# Patient Record
Sex: Female | Born: 1975
Health system: Southern US, Community
[De-identification: ages and names within clinical notes are randomized; demographics above are authoritative.]

## PROBLEM LIST (undated history)

## (undated) DIAGNOSIS — E039 Hypothyroidism, unspecified: Secondary | ICD-10-CM

## (undated) DIAGNOSIS — E782 Mixed hyperlipidemia: Secondary | ICD-10-CM

## (undated) DIAGNOSIS — E559 Vitamin D deficiency, unspecified: Secondary | ICD-10-CM

## (undated) DIAGNOSIS — I10 Essential (primary) hypertension: Secondary | ICD-10-CM

## (undated) DIAGNOSIS — R7303 Prediabetes: Secondary | ICD-10-CM

## (undated) HISTORY — DX: Prediabetes: R73.03

## (undated) HISTORY — DX: Hypothyroidism, unspecified: E03.9

## (undated) HISTORY — DX: Vitamin D deficiency, unspecified: E55.9

## (undated) HISTORY — DX: Mixed hyperlipidemia: E78.2

## (undated) HISTORY — DX: Essential (primary) hypertension: I10

---

## 2010-03-28 ENCOUNTER — Other Ambulatory Visit (HOSPITAL_COMMUNITY): Payer: Self-pay | Admitting: Obstetrics and Gynecology

## 2010-03-28 ENCOUNTER — Other Ambulatory Visit: Payer: Self-pay | Admitting: Obstetrics and Gynecology

## 2010-03-28 DIAGNOSIS — N852 Hypertrophy of uterus: Secondary | ICD-10-CM

## 2010-03-28 DIAGNOSIS — N920 Excessive and frequent menstruation with regular cycle: Secondary | ICD-10-CM

## 2010-03-28 DIAGNOSIS — N926 Irregular menstruation, unspecified: Secondary | ICD-10-CM

## 2010-04-07 ENCOUNTER — Ambulatory Visit (HOSPITAL_COMMUNITY)
Admission: RE | Admit: 2010-04-07 | Discharge: 2010-04-07 | Disposition: A | Discharge status: Home / Self Care | Payer: Self-pay | Source: Ambulatory Visit | Attending: Obstetrics and Gynecology | Admitting: Obstetrics and Gynecology

## 2010-04-07 DIAGNOSIS — N920 Excessive and frequent menstruation with regular cycle: Secondary | ICD-10-CM

## 2010-04-07 DIAGNOSIS — N852 Hypertrophy of uterus: Secondary | ICD-10-CM | POA: Insufficient documentation

## 2010-04-07 DIAGNOSIS — N926 Irregular menstruation, unspecified: Secondary | ICD-10-CM | POA: Insufficient documentation

## 2011-07-03 ENCOUNTER — Other Ambulatory Visit (HOSPITAL_COMMUNITY)
Admission: RE | Admit: 2011-07-03 | Discharge: 2011-07-03 | Disposition: A | Discharge status: Home / Self Care | Payer: Self-pay | Source: Ambulatory Visit | Attending: Unknown Physician Specialty | Admitting: Unknown Physician Specialty

## 2011-07-03 DIAGNOSIS — N870 Mild cervical dysplasia: Secondary | ICD-10-CM | POA: Insufficient documentation

## 2011-07-03 DIAGNOSIS — R87612 Low grade squamous intraepithelial lesion on cytologic smear of cervix (LGSIL): Secondary | ICD-10-CM | POA: Insufficient documentation

## 2012-03-19 IMAGING — US US PELV - US TRANSVAGINAL
1 series · 14 of 25 positions shown · non-contrast
Comparison: None

CLINICAL DATA: Enlarged uterus, irregular heavy menses



[Series 1: us pelv - us transvaginal · 0.21mm/px · 14 of 69 slices shown]
[im 1/69]
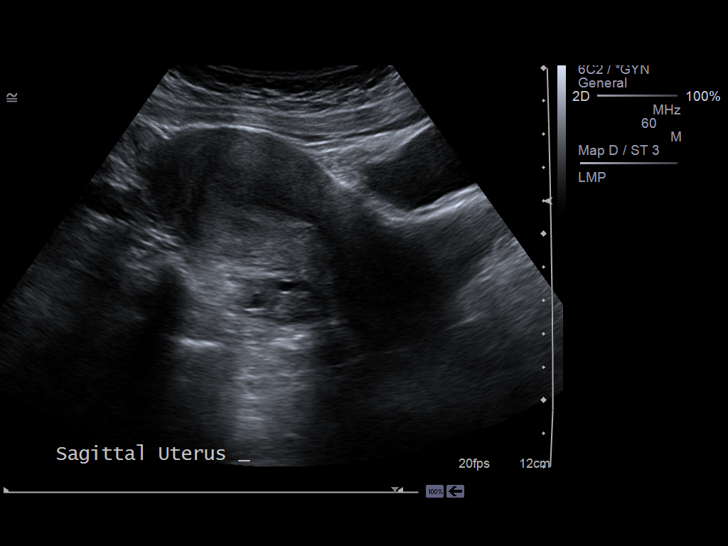
[im 6/69]
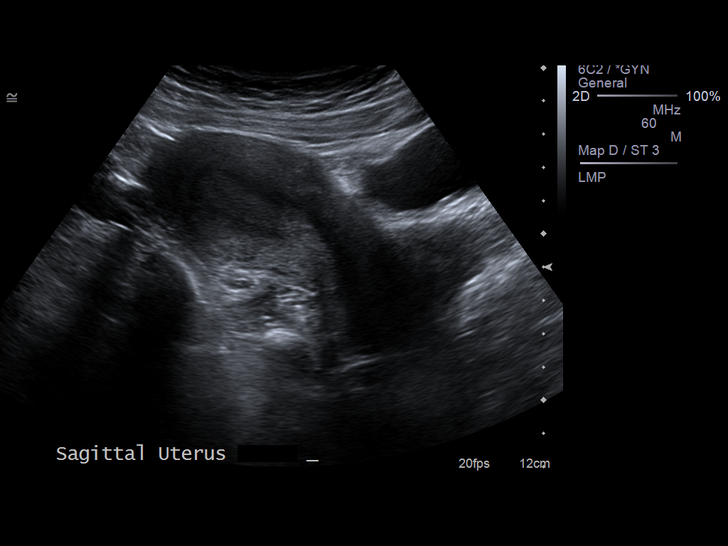
[im 12/69]
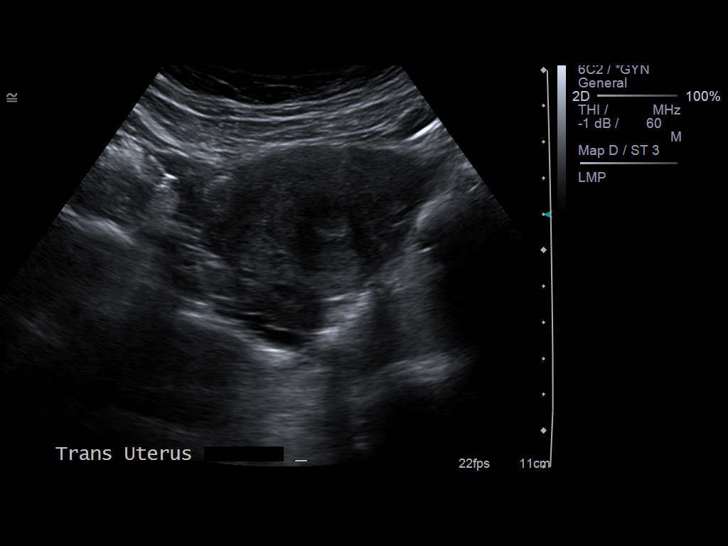
[im 18/69]
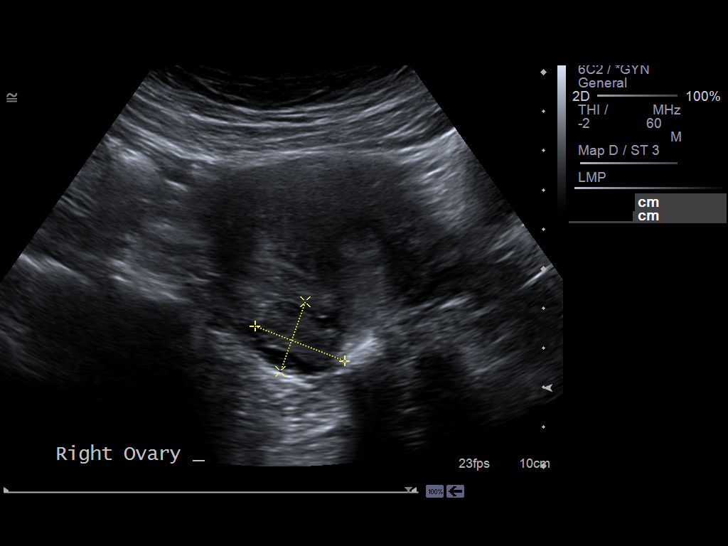
[im 23/69]
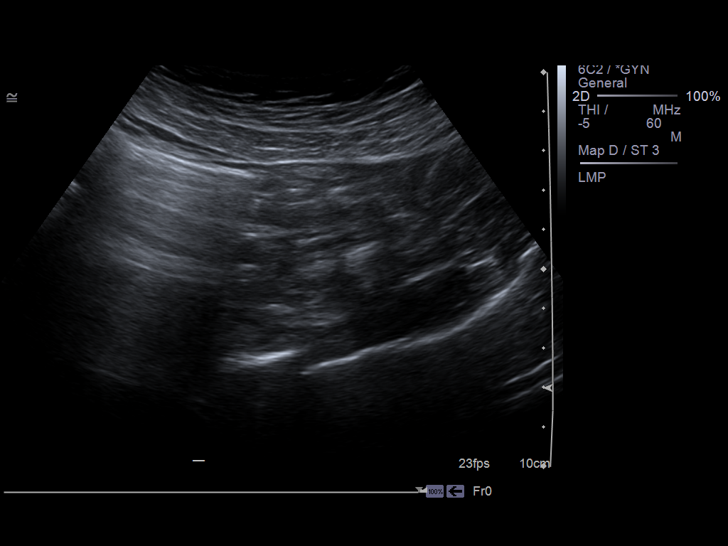
[im 26/69]
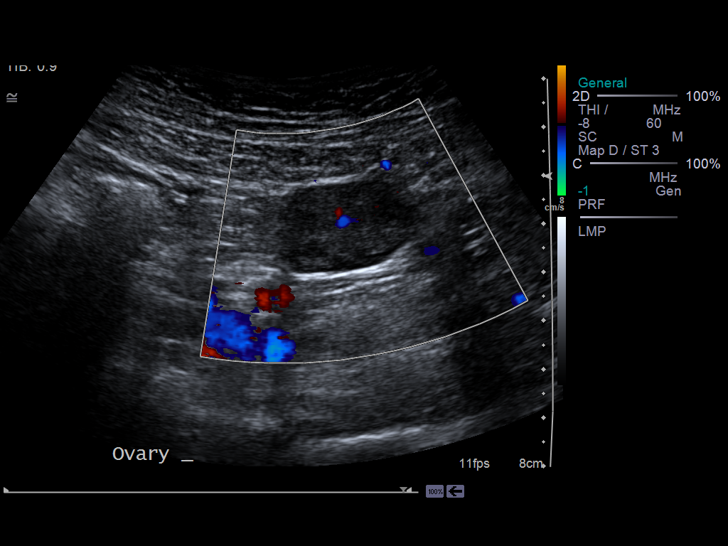
[im 32/69]
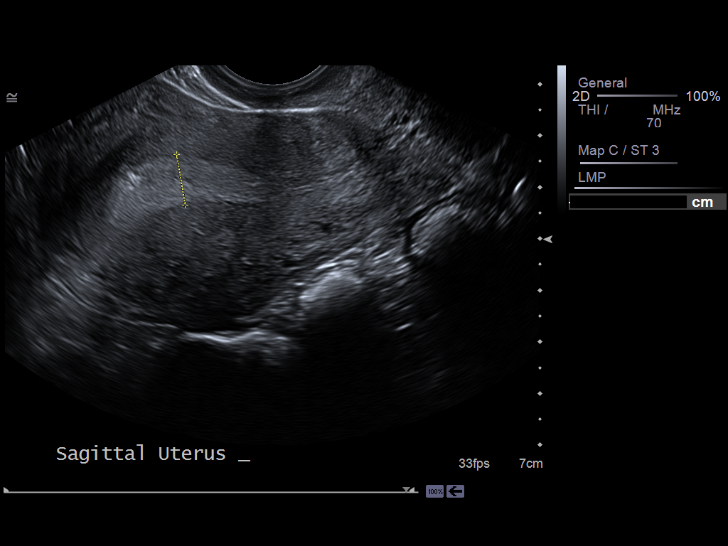
[im 37/69]
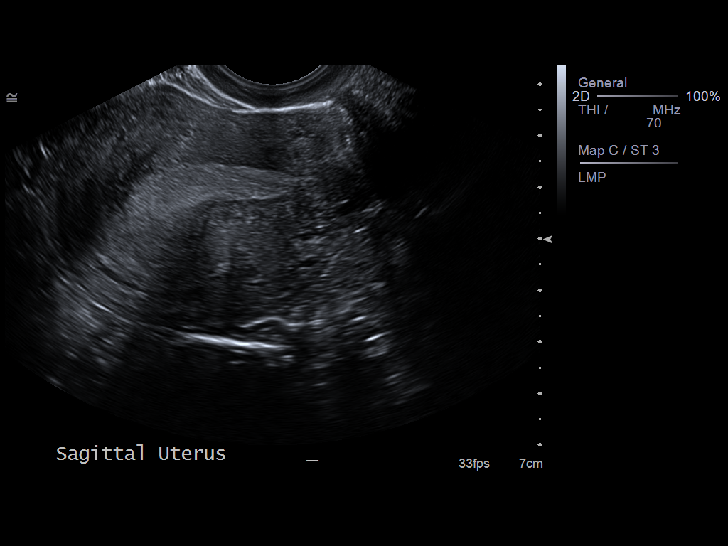
[im 43/69]
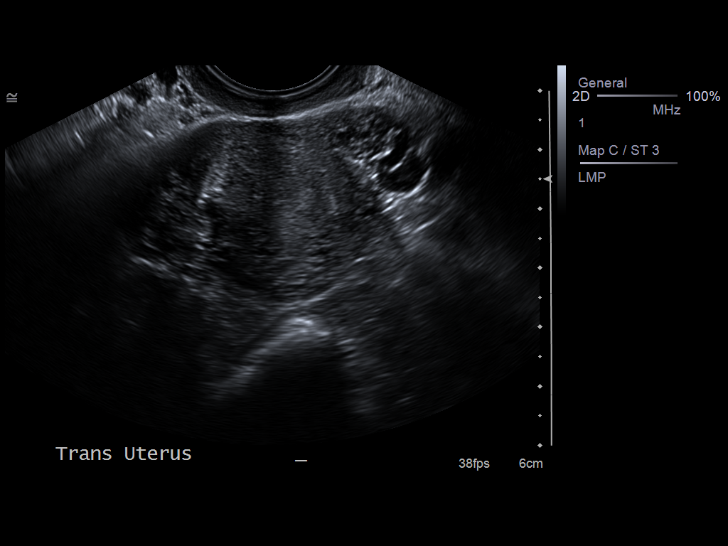
[im 46/69]
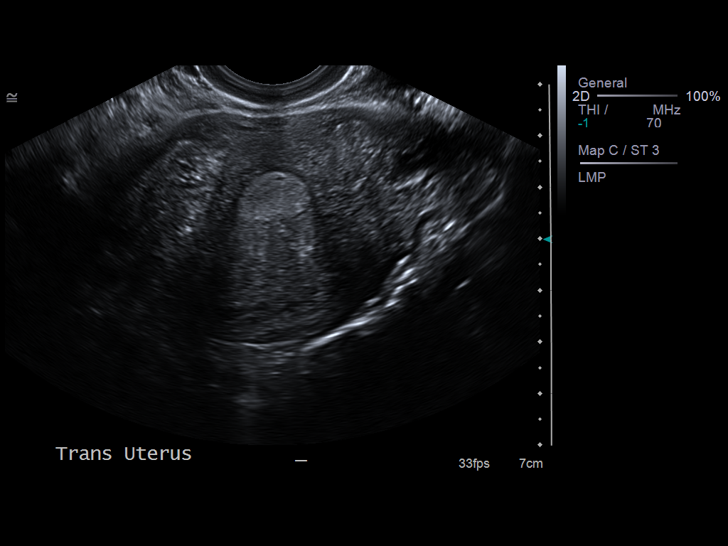
[im 52/69]
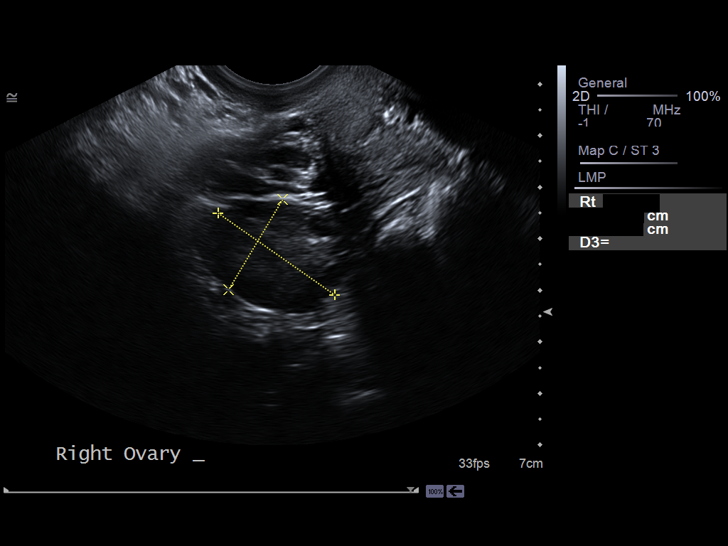
[im 57/69]
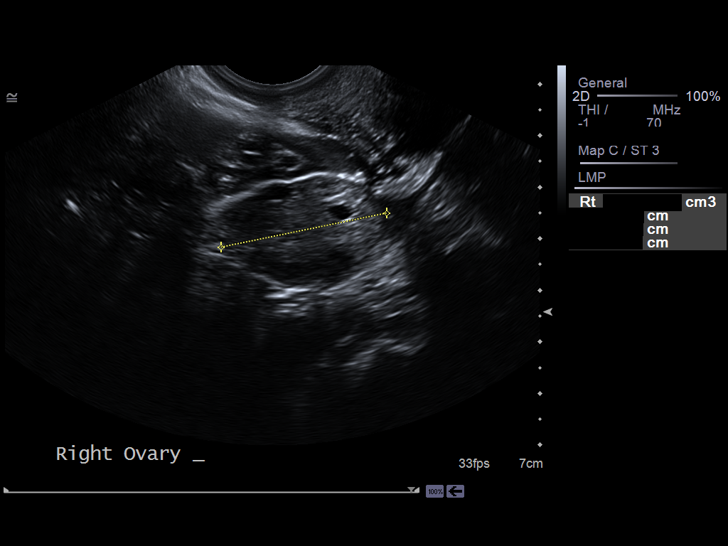
[im 63/69]
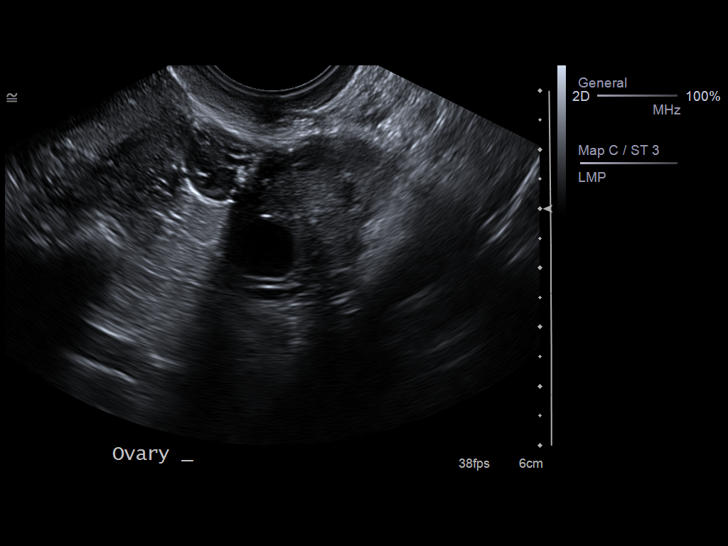
[im 69/69]
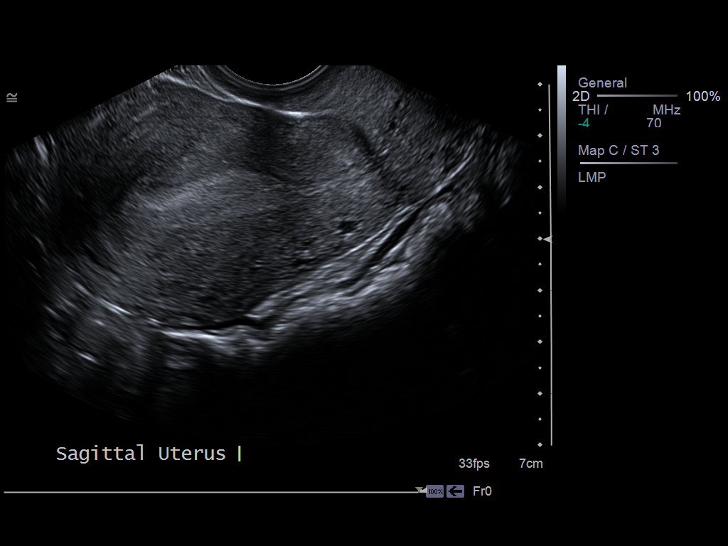

[14 of 25 positions shown; findings below may reference images not displayed]

FINDINGS: Uterus measures 8.7 cm length by 4.5 cm AP by 5.7 cm transverse.
Heterogeneous appearing myometrium without focal mass.

Endometrium measures 10 mm thick, upper normal.  No endometrial
fluid.

Right Ovary measures 2.0 x 2.0 x 3.3 cm.  Normal morphology without
mass.

Left Ovary measures 3.3 x 2.1 x 2.5 cm.  Normal morphology without
mass.

Other Findings:  No adnexal masses or free pelvic fluid.
IMPRESSION: Unremarkable sonography of the pelvis.

## 2012-08-12 ENCOUNTER — Encounter (HOSPITAL_COMMUNITY): Payer: Self-pay | Admitting: *Deleted

## 2012-08-12 ENCOUNTER — Emergency Department (HOSPITAL_COMMUNITY)
Admission: EM | Admit: 2012-08-12 | Discharge: 2012-08-12 | Disposition: A | Discharge status: Home / Self Care | Payer: BC Managed Care – PPO | Attending: Emergency Medicine | Admitting: Emergency Medicine

## 2012-08-12 DIAGNOSIS — R61 Generalized hyperhidrosis: Secondary | ICD-10-CM | POA: Insufficient documentation

## 2012-08-12 DIAGNOSIS — R000 Tachycardia, unspecified: Secondary | ICD-10-CM | POA: Insufficient documentation

## 2012-08-12 DIAGNOSIS — Z79899 Other long term (current) drug therapy: Secondary | ICD-10-CM | POA: Insufficient documentation

## 2012-08-12 DIAGNOSIS — E0590 Thyrotoxicosis, unspecified without thyrotoxic crisis or storm: Secondary | ICD-10-CM | POA: Insufficient documentation

## 2012-08-12 DIAGNOSIS — R634 Abnormal weight loss: Secondary | ICD-10-CM | POA: Insufficient documentation

## 2012-08-12 LAB — COMPREHENSIVE METABOLIC PANEL
ALT: 36 U/L — ABNORMAL HIGH (ref 0–35)
AST: 22 U/L (ref 0–37)
Albumin: 3.3 g/dL — ABNORMAL LOW (ref 3.5–5.2)
Alkaline Phosphatase: 142 U/L — ABNORMAL HIGH (ref 39–117)
BUN: 15 mg/dL (ref 6–23)
CO2: 30 mEq/L (ref 19–32)
Calcium: 9.9 mg/dL (ref 8.4–10.5)
Chloride: 97 mEq/L (ref 96–112)
Creatinine, Ser: 0.59 mg/dL (ref 0.50–1.10)
GFR calc Af Amer: >90 mL/min (ref >90)
GFR calc non Af Amer: >90 mL/min (ref >90)
Glucose, Bld: 119 mg/dL — ABNORMAL HIGH (ref 70–99)
Potassium: 3.9 mEq/L (ref 3.5–5.1)
Sodium: 137 mEq/L (ref 135–145)
Total Bilirubin: 0.2 mg/dL — ABNORMAL LOW (ref 0.3–1.2)
Total Protein: 8.6 g/dL — ABNORMAL HIGH (ref 6.0–8.3)

## 2012-08-12 LAB — CBC WITH DIFFERENTIAL/PLATELET
Basophils Absolute: 0 10*3/uL (ref 0.0–0.1)
Basophils Relative: 0 % (ref 0–1)
Eosinophils Absolute: 0 10*3/uL (ref 0.0–0.7)
Eosinophils Relative: 1 % (ref 0–5)
HCT: 35.3 % — ABNORMAL LOW (ref 36.0–46.0)
Hemoglobin: 11.9 g/dL — ABNORMAL LOW (ref 12.0–15.0)
Lymphocytes Relative: 19 % (ref 12–46)
Lymphs Abs: 1.5 10*3/uL (ref 0.7–4.0)
MCH: 30.7 pg (ref 26.0–34.0)
MCHC: 33.7 g/dL (ref 30.0–36.0)
MCV: 91.2 fL (ref 78.0–100.0)
Monocytes Absolute: 0.9 10*3/uL (ref 0.1–1.0)
Monocytes Relative: 11 % (ref 3–12)
Neutro Abs: 5.5 10*3/uL (ref 1.7–7.7)
Neutrophils Relative %: 69 % (ref 43–77)
Platelets: 734 10*3/uL — ABNORMAL HIGH (ref 150–400)
RBC: 3.87 MIL/uL (ref 3.87–5.11)
RDW: 12.7 % (ref 11.5–15.5)
WBC: 7.9 10*3/uL (ref 4.0–10.5)

## 2012-08-12 MED ORDER — METOPROLOL TARTRATE 50 MG PO TABS: 50.0000 mg | ORAL_TABLET | Freq: Two times a day (BID) | ORAL | Status: DC

## 2012-08-12 MED ORDER — METHIMAZOLE 10 MG PO TABS: 10.0000 mg | ORAL_TABLET | Freq: Two times a day (BID) | ORAL | Status: DC

## 2012-08-12 NOTE — ED Notes (Signed)
Asymptomatic at this time 

## 2012-08-12 NOTE — ED Notes (Signed)
Last Friday had fever and rash on hands and difficulty swallowing and headache.   Saw PMD week ago Friday and had lab work drawn.  Notified today of high thyroid level.  Labs showed T4 of 26.7 and T3 of 456.5.  TSH 0 .11

## 2012-08-12 NOTE — ED Provider Notes (Signed)
History  This chart was scribed for Renee Razor, MD by Greggory Stallion, ED Scribe. This patient was seen in room [facility]14[facility]14 and the patient's care was started at 5:55 PM.  CSN: [id number]  Arrival date & time 08/12/12  1655    Chief Complaint  Patient presents with  . Hyperthyroidism     The history is provided by the patient. No language interpreter was used.    HPI Comments: Renee Zamora is a 37 y.o. female who presents to the Emergency Department complaining of hyperthyroidism. She states she has been sweating a lot and she states she has lost 10 lbs. Pt states she had a fever and rash on her hands last Friday. She states she also had difficulty swallowing and headache. She states she saw PMD one week ago and had lab work done and was notified today of high thyroid level. She states she has been feeling tachycardiac so her doctor put her on medication for it. Pt denies neck pain, feeling anxious, sore throat, visual disturbance, CP, cough, SOB, abdominal pain, nausea, emesis, diarrhea, urinary symptoms, back pain, HA, weakness, numbness and rash as associated symptoms. She states she has an appointment with her thyroid doctor June 25.   History reviewed. No pertinent past medical history.  History reviewed. No pertinent past surgical history.  History reviewed. No pertinent family history.  History  Substance Use Topics  . Smoking status: Never Smoker   . Smokeless tobacco: Not on file  . Alcohol Use: No    OB History   Grav Para Term Preterm Abortions TAB SAB Ect Mult Living   1 1 1              Review of Systems  HENT: Negative for sore throat and neck pain.   Eyes: Negative for visual disturbance.  Respiratory: Negative for cough and shortness of breath.   Cardiovascular: Negative for chest pain.  Gastrointestinal: Negative for nausea, vomiting, abdominal pain and diarrhea.  Genitourinary: Negative for dysuria.  Musculoskeletal: Negative for back pain.  Skin:  Negative for rash.  All other systems reviewed and are negative.    Allergies  Review of patient's allergies indicates no known allergies.  Home Medications   Current Outpatient Rx  Name  Route  Sig  Dispense  Refill  . acetaminophen (TYLENOL) 500 MG tablet   Oral   Take 500 mg by mouth every 6 (six) hours as needed for pain.         Marland Kitchen azithromycin (ZITHROMAX) 250 MG tablet   Oral   Take 250-500 mg by mouth daily. Take two tablets on day 1, then take one tablet on days 2 through 5         . metoprolol tartrate (LOPRESSOR) 25 MG tablet   Oral   Take 25 mg by mouth 2 (two) times daily.         . norgestrel-ethinyl estradiol (CRYSELLE-28) 0.3-30 MG-MCG tablet   Oral   Take 1 tablet by mouth at bedtime.           BP 142/95  Pulse 121  Temp(Src) 98.9 F (37.2 C) (Oral)  Resp 17  Ht 5' (1.524 m)  Wt 127 lb 12.8 oz (57.97 kg)  BMI 24.96 kg/m2  SpO2 100%  LMP 07/29/2012  Physical Exam  Nursing note and vitals reviewed. Constitutional: She appears well-developed and well-nourished. No distress.  HENT:  Head: Normocephalic and atraumatic.  Right Ear: External ear normal.  Left Ear: External ear normal.  Thyroid feels  enlarged. No nodularity appreciated.  Eyes: Conjunctivae are normal. Right eye exhibits no discharge. Left eye exhibits no discharge. No scleral icterus.  Neck: Neck supple. No tracheal deviation present.  Cardiovascular: Normal rate and intact distal pulses.   Tachycardic.   Pulmonary/Chest: Effort normal and breath sounds normal. No stridor. No respiratory distress. She has no wheezes. She has no rales.  Abdominal: Soft. Bowel sounds are normal. She exhibits no distension. There is no tenderness. There is no rebound and no guarding.  Musculoskeletal: She exhibits no edema and no tenderness.  Neurological: She is alert. She has normal strength. No sensory deficit. Cranial nerve deficit:  no gross defecits noted. She exhibits normal muscle tone. She  displays no seizure activity. Coordination normal.  Skin: Skin is warm and dry. No rash noted.  Psychiatric: She has a normal mood and affect.    ED Course  Procedures (including critical care time)  DIAGNOSTIC STUDIES: Oxygen Saturation is 100% on RA, normal by my interpretation.    COORDINATION OF CARE: 6:28 PM-Discussed treatment plan with pt at bedside and pt agreed to plan.   7:34 PM- Alerted pt of discharge with medication changes and additions.   Labs Reviewed  COMPREHENSIVE METABOLIC PANEL - Abnormal; Notable for the following:    Glucose, Bld 119 (*)    Total Protein 8.6 (*)    Albumin 3.3 (*)    ALT 36 (*)    Alkaline Phosphatase 142 (*)    Total Bilirubin 0.2 (*)    All other components within normal limits  CBC WITH DIFFERENTIAL - Abnormal; Notable for the following:    Hemoglobin 11.9 (*)    HCT 35.3 (*)    Platelets 734 (*)    All other components within normal limits   No results found.   1. Hyperthyroidism       MDM  37yf with hyperthyroidism. b-blocker increased. Pt started on low dose of methimazole. Thrombocytosis likely reactive in nature. CBC otherwise normal. LFTs OK. Pt has follow-up later this month. Return precautions discussed.     I personally preformed the services scribed in my presence. The recorded information has been reviewed is accurate. Renee Razor, MD.   Renee Razor, MD 08/19/12 [phone]

## 2012-11-14 ENCOUNTER — Other Ambulatory Visit (HOSPITAL_COMMUNITY): Payer: Self-pay | Admitting: "Endocrinology

## 2012-11-14 DIAGNOSIS — E0590 Thyrotoxicosis, unspecified without thyrotoxic crisis or storm: Secondary | ICD-10-CM

## 2012-11-24 ENCOUNTER — Ambulatory Visit (HOSPITAL_COMMUNITY): Payer: Self-pay

## 2012-11-25 ENCOUNTER — Encounter (HOSPITAL_COMMUNITY)
Admission: RE | Admit: 2012-11-25 | Discharge: 2012-11-25 | Disposition: A | Discharge status: Home / Self Care | Payer: BC Managed Care – PPO | Source: Ambulatory Visit | Attending: "Endocrinology | Admitting: "Endocrinology

## 2012-11-25 ENCOUNTER — Encounter (HOSPITAL_COMMUNITY): Payer: Self-pay

## 2012-11-25 ENCOUNTER — Other Ambulatory Visit (HOSPITAL_COMMUNITY): Payer: Self-pay

## 2012-11-25 DIAGNOSIS — E0590 Thyrotoxicosis, unspecified without thyrotoxic crisis or storm: Secondary | ICD-10-CM

## 2012-11-25 MED ORDER — SODIUM IODIDE I 131 CAPSULE
15.0000 | Freq: Once | INTRAVENOUS | Status: AC | PRN
  Administered 2012-11-25: 15 via ORAL

## 2012-11-26 ENCOUNTER — Encounter (HOSPITAL_COMMUNITY)
Admission: RE | Admit: 2012-11-26 | Discharge: 2012-11-26 | Disposition: A | Discharge status: Home / Self Care | Payer: BC Managed Care – PPO | Source: Ambulatory Visit | Attending: "Endocrinology | Admitting: "Endocrinology

## 2012-11-26 ENCOUNTER — Encounter (HOSPITAL_COMMUNITY): Payer: Self-pay

## 2012-11-26 MED ORDER — SODIUM PERTECHNETATE TC 99M INJECTION
10.0000 | Freq: Once | INTRAVENOUS | Status: AC | PRN
  Administered 2012-11-26: 11 via INTRAVENOUS

## 2014-01-04 ENCOUNTER — Encounter (HOSPITAL_COMMUNITY): Payer: Self-pay

## 2014-12-27 ENCOUNTER — Telehealth: Payer: Self-pay | Admitting: "Endocrinology

## 2014-12-27 MED ORDER — METFORMIN HCL 500 MG PO TABS: 500.0000 mg | ORAL_TABLET | Freq: Every day | ORAL | Status: DC

## 2014-12-27 NOTE — Telephone Encounter (Signed)
needs her acetaminphen refilled

## 2014-12-27 NOTE — Addendum Note (Signed)
Addended by: Jannifer FranklinFRENCH, KIMBERLY A on: 12/27/2014 03:42 PM   Modules accepted: Orders

## 2015-01-07 ENCOUNTER — Other Ambulatory Visit: Payer: Self-pay | Admitting: "Endocrinology

## 2015-05-16 ENCOUNTER — Encounter: Payer: Self-pay | Admitting: "Endocrinology

## 2015-05-16 ENCOUNTER — Ambulatory Visit (INDEPENDENT_AMBULATORY_CARE_PROVIDER_SITE_OTHER): Payer: BLUE CROSS/BLUE SHIELD | Admitting: "Endocrinology

## 2015-05-16 VITALS — BP 130/87 | HR 79 | Ht 62.0 in | Wt 156.0 lb

## 2015-05-16 DIAGNOSIS — I10 Essential (primary) hypertension: Secondary | ICD-10-CM | POA: Diagnosis not present

## 2015-05-16 DIAGNOSIS — R7303 Prediabetes: Secondary | ICD-10-CM | POA: Diagnosis not present

## 2015-05-16 DIAGNOSIS — E559 Vitamin D deficiency, unspecified: Secondary | ICD-10-CM

## 2015-05-16 DIAGNOSIS — E039 Hypothyroidism, unspecified: Secondary | ICD-10-CM | POA: Diagnosis not present

## 2015-05-16 DIAGNOSIS — E785 Hyperlipidemia, unspecified: Secondary | ICD-10-CM | POA: Diagnosis not present

## 2015-05-16 DIAGNOSIS — E782 Mixed hyperlipidemia: Secondary | ICD-10-CM | POA: Insufficient documentation

## 2015-05-16 MED ORDER — VITAMIN D (ERGOCALCIFEROL) 1.25 MG (50000 UNIT) PO CAPS: 50000.0000 [IU] | ORAL_CAPSULE | ORAL | Status: DC

## 2015-05-16 MED ORDER — LEVOTHYROXINE SODIUM 88 MCG PO TABS: 88.0000 ug | ORAL_TABLET | Freq: Every day | ORAL | Status: DC

## 2015-05-16 MED ORDER — METFORMIN HCL 500 MG PO TABS: 500.0000 mg | ORAL_TABLET | Freq: Every day | ORAL | Status: DC

## 2015-05-16 MED ORDER — HYDROCHLOROTHIAZIDE 25 MG PO TABS: 25.0000 mg | ORAL_TABLET | Freq: Every morning | ORAL | Status: DC

## 2015-05-16 NOTE — Progress Notes (Signed)
Subjective:    Patient ID: Renee CharlestonBerenice Zamora, female    DOB: 09/13/1975, PCP Default, Provider, MD   History reviewed. No pertinent past medical history. History reviewed. No pertinent past surgical history. Social History   Social History  . Marital Status: Single    Spouse Name: N/A  . Number of Children: N/A  . Years of Education: N/A   Social History Main Topics  . Smoking status: Never Smoker   . Smokeless tobacco: None  . Alcohol Use: No  . Drug Use: No  . Sexual Activity: Yes    Birth Control/ Protection: Pill   Other Topics Concern  . None   Social History Narrative   Outpatient Encounter Prescriptions as of 05/16/2015  Medication Sig  . levothyroxine (SYNTHROID, LEVOTHROID) 88 MCG tablet Take 1 tablet (88 mcg total) by mouth daily before breakfast.  . [DISCONTINUED] levothyroxine (SYNTHROID, LEVOTHROID) 88 MCG tablet Take 88 mcg by mouth daily before breakfast.  . hydrochlorothiazide (HYDRODIURIL) 25 MG tablet Take 1 tablet (25 mg total) by mouth every morning.  . metFORMIN (GLUCOPHAGE) 500 MG tablet Take 1 tablet (500 mg total) by mouth daily with breakfast.  . Vitamin D, Ergocalciferol, (DRISDOL) 50000 units CAPS capsule Take 1 capsule (50,000 Units total) by mouth every 7 (seven) days.  . [DISCONTINUED] acetaminophen (TYLENOL) 500 MG tablet Take 500 mg by mouth every 6 (six) hours as needed for pain.  . [DISCONTINUED] azithromycin (ZITHROMAX) 250 MG tablet Take 250-500 mg by mouth daily. Take two tablets on day 1, then take one tablet on days 2 through 5  . [DISCONTINUED] hydrochlorothiazide (HYDRODIURIL) 25 MG tablet TAKE ONE TABLET BY MOUTH EVERY MORNING  . [DISCONTINUED] metFORMIN (GLUCOPHAGE) 500 MG tablet Take 1 tablet (500 mg total) by mouth daily with breakfast.  . [DISCONTINUED] methimazole (TAPAZOLE) 10 MG tablet Take 1 tablet (10 mg total) by mouth 2 (two) times daily.  . [DISCONTINUED] metoprolol (LOPRESSOR) 50 MG tablet Take 1 tablet (50 mg total) by  mouth 2 (two) times daily.  . [DISCONTINUED] metoprolol tartrate (LOPRESSOR) 25 MG tablet Take 25 mg by mouth 2 (two) times daily.  . [DISCONTINUED] norgestrel-ethinyl estradiol (CRYSELLE-28) 0.3-30 MG-MCG tablet Take 1 tablet by mouth at bedtime.   No facility-administered encounter medications on file as of 05/16/2015.   ALLERGIES: No Known Allergies VACCINATION STATUS:  There is no immunization history on file for this patient.  HPI Renee Zamora is a 40 - yr-old female with recent diagnosis of Hashimoto thyroiditis with hypothyroidism. She was put on LT4 88 mcg po qam. she tolerated better . she continues to gain weight. she c/o skin rash. she has started an OCP 3 months ago which she thinks is causing the rash. the ocp was prescribed to decrease heavy menstual flow. Pt denies family history of thyroid dysfunction. she denies personal history of goiter.  Review of Systems Constitutional: no weight gain/loss, no fatigue, no subjective hyperthermia/hypothermia Eyes: no blurry vision, no xerophthalmia ENT: no sore throat, no nodules palpated in throat, no dysphagia/odynophagia, no hoarseness Cardiovascular: no CP/SOB/palpitations/leg swelling Respiratory: no cough/SOB Gastrointestinal: no N/V/D/C Musculoskeletal: no muscle/joint aches Skin: no rashes Neurological: no tremors/numbness/tingling/dizziness Psychiatric: no depression/anxiety  Objective:    BP 130/87 mmHg  Pulse 79  Ht 5\' 2"  (1.575 m)  Wt 156 lb (70.761 kg)  BMI 28.53 kg/m2  SpO2 97%  Wt Readings from Last 3 Encounters:  05/16/15 156 lb (70.761 kg)  08/12/12 127 lb 12.8 oz (57.97 kg)    Physical Exam  Constitutional: overweight, in NAD Eyes: PERRLA, EOMI, no exophthalmos ENT: moist mucous membranes, no thyromegaly, no cervical lymphadenopathy Cardiovascular: RRR, No MRG Respiratory: CTA B Gastrointestinal: abdomen soft, NT, ND, BS+ Musculoskeletal: no deformities, strength intact in all 4 Skin: moist, warm,  no rashes Neurological: no tremor with outstretched hands, DTR normal in all 4  CMP     Component Value Date/Time   NA 137 08/12/2012 1816   K 3.9 08/12/2012 1816   CL 97 08/12/2012 1816   CO2 30 08/12/2012 1816   GLUCOSE 119* 08/12/2012 1816   BUN 15 08/12/2012 1816   CREATININE 0.59 08/12/2012 1816   CALCIUM 9.9 08/12/2012 1816   PROT 8.6* 08/12/2012 1816   ALBUMIN 3.3* 08/12/2012 1816   AST 22 08/12/2012 1816   ALT 36* 08/12/2012 1816   ALKPHOS 142* 08/12/2012 1816   BILITOT 0.2* 08/12/2012 1816   GFRNONAA >90 08/12/2012 1816   GFRAA >90 08/12/2012 1816     Assessment & Plan:   1. Hypothyroidism, unspecified hypothyroidism type  Her TFTs are c/w appropriate replacement. I will continue Synthroid 88 mcg po qam.  She understands, she will need thyroid hormone for life.    2. Essential hypertension, benign For HTN, I have cunseled her on salt restriction and , and will continue HCTZ 25 mcg po qam.  3. Hyperlipidemia - Patient is advised to avoid butter and fried food.  4. Vitamin D deficiency -New diagnosis, will start Vitamin D 50 K units weekly x 12 weeks.  5. Pre-diabetes  I will continue MTF  po qday. Carbs and exercise regiment advised.    - 25 minutes of time was spent on the care of this patient , 50% of which was applied for counseling on diabetes complications and their preventions.  - I advised patient to maintain close follow up with Default, Provider, MD for primary care needs. Follow up plan: Return in about 6 months (around 11/16/2015) for prediabetes, high blood pressure, underactive thyroid, Vitamin D deficiency, follow up with pre-visit labs.  Marquis Lunch, MD Phone: [phone]  Fax: [phone]   05/16/2015, 7:25 PM

## 2015-05-23 ENCOUNTER — Telehealth: Payer: Self-pay

## 2015-05-23 ENCOUNTER — Encounter: Payer: Self-pay | Admitting: "Endocrinology

## 2015-05-23 NOTE — Telephone Encounter (Signed)
I wanted her to make sure she is not taking Methimazole she may have at home.

## 2015-05-23 NOTE — Telephone Encounter (Signed)
Pt.notified

## 2015-05-23 NOTE — Telephone Encounter (Signed)
Pt states that there was a medication she was supposed to d/c? She does not remember which one. I did not see anything in pts chart.

## 2015-11-10 ENCOUNTER — Other Ambulatory Visit: Payer: Self-pay | Admitting: "Endocrinology

## 2015-11-10 DIAGNOSIS — E1169 Type 2 diabetes mellitus with other specified complication: Secondary | ICD-10-CM

## 2015-11-10 DIAGNOSIS — E1165 Type 2 diabetes mellitus with hyperglycemia: Secondary | ICD-10-CM

## 2015-11-10 DIAGNOSIS — IMO0002 Reserved for concepts with insufficient information to code with codable children: Secondary | ICD-10-CM

## 2015-11-10 DIAGNOSIS — E039 Hypothyroidism, unspecified: Secondary | ICD-10-CM

## 2015-11-12 LAB — BASIC METABOLIC PANEL
BUN: 13 mg/dL (ref 7–25)
CALCIUM: 9.3 mg/dL (ref 8.6–10.2)
CHLORIDE: 101 mmol/L (ref 98–110)
CO2: 26 mmol/L (ref 20–31)
CREATININE: 0.74 mg/dL (ref 0.50–1.10)
Glucose, Bld: 149 mg/dL — ABNORMAL HIGH (ref 65–99)
Potassium: 4.3 mmol/L (ref 3.5–5.3)
Sodium: 137 mmol/L (ref 135–146)

## 2015-11-12 LAB — T4, FREE: FREE T4: 1.5 ng/dL (ref 0.8–1.8)

## 2015-11-12 LAB — TSH: TSH: 0.5 m[IU]/L

## 2015-11-13 LAB — HEMOGLOBIN A1C
HEMOGLOBIN A1C: 5.8 % — AB (ref <5.7)
MEAN PLASMA GLUCOSE: 120 mg/dL

## 2015-11-16 ENCOUNTER — Ambulatory Visit (INDEPENDENT_AMBULATORY_CARE_PROVIDER_SITE_OTHER): Payer: BLUE CROSS/BLUE SHIELD | Admitting: "Endocrinology

## 2015-11-16 ENCOUNTER — Encounter: Payer: Self-pay | Admitting: "Endocrinology

## 2015-11-16 VITALS — BP 128/72 | HR 78 | Resp 18 | Ht 62.0 in | Wt 162.0 lb

## 2015-11-16 DIAGNOSIS — E038 Other specified hypothyroidism: Secondary | ICD-10-CM

## 2015-11-16 DIAGNOSIS — I10 Essential (primary) hypertension: Secondary | ICD-10-CM

## 2015-11-16 DIAGNOSIS — E785 Hyperlipidemia, unspecified: Secondary | ICD-10-CM

## 2015-11-16 DIAGNOSIS — R7303 Prediabetes: Secondary | ICD-10-CM | POA: Diagnosis not present

## 2015-11-16 DIAGNOSIS — E559 Vitamin D deficiency, unspecified: Secondary | ICD-10-CM

## 2015-11-16 MED ORDER — LEVOTHYROXINE SODIUM 88 MCG PO TABS: 88.0000 ug | ORAL_TABLET | Freq: Every day | ORAL | 6 refills | Status: DC

## 2015-11-16 MED ORDER — HYDROCHLOROTHIAZIDE 25 MG PO TABS: 25.0000 mg | ORAL_TABLET | Freq: Every morning | ORAL | 6 refills | Status: DC

## 2015-11-16 MED ORDER — METFORMIN HCL ER 500 MG PO TB24: 500.0000 mg | ORAL_TABLET | Freq: Every day | ORAL | 6 refills | Status: DC

## 2015-11-16 NOTE — Progress Notes (Signed)
Subjective:    Patient ID: Renee Zamora, female    DOB: 07/05/1975, PCP Default, Provider, MD   No past medical history on file. No past surgical history on file. Social History   Social History  . Marital status: Single    Spouse name: N/A  . Number of children: N/A  . Years of education: N/A   Social History Main Topics  . Smoking status: Never Smoker  . Smokeless tobacco: None  . Alcohol use No  . Drug use: No  . Sexual activity: Yes    Birth control/ protection: Pill   Other Topics Concern  . None   Social History Narrative  . None   Outpatient Encounter Prescriptions as of 11/16/2015  Medication Sig  . hydrochlorothiazide (HYDRODIURIL) 25 MG tablet Take 1 tablet (25 mg total) by mouth every morning.  Marland Kitchen. levothyroxine (SYNTHROID, LEVOTHROID) 88 MCG tablet Take 1 tablet (88 mcg total) by mouth daily before breakfast.  . [DISCONTINUED] hydrochlorothiazide (HYDRODIURIL) 25 MG tablet Take 1 tablet (25 mg total) by mouth every morning.  . [DISCONTINUED] levothyroxine (SYNTHROID, LEVOTHROID) 88 MCG tablet Take 1 tablet (88 mcg total) by mouth daily before breakfast.  . [DISCONTINUED] metFORMIN (GLUCOPHAGE) 500 MG tablet Take 1 tablet (500 mg total) by mouth daily with breakfast.  . [DISCONTINUED] Vitamin D, Ergocalciferol, (DRISDOL) 50000 units CAPS capsule Take 1 capsule (50,000 Units total) by mouth every 7 (seven) days.  . metFORMIN (GLUCOPHAGE XR) 500 MG 24 hr tablet Take 1 tablet (500 mg total) by mouth daily with breakfast.   No facility-administered encounter medications on file as of 11/16/2015.    ALLERGIES: No Known Allergies VACCINATION STATUS:  There is no immunization history on file for this patient.  HPI Renee Zamora is a 40 - yr-old female with  diagnosis of Hashimoto thyroiditis with hypothyroidism, hypertension, prediabetes, and vitamin D deficiency. She was initiated on levothyroxine 88 mcg po qam. she tolerated better . she continues to gain  weight. - For unclear reasons, she has stopped taking metformin.  Pt denies family history of thyroid dysfunction. she denies personal history of goiter.  Review of Systems Constitutional: no weight gain/loss, no fatigue, no subjective hyperthermia/hypothermia Eyes: no blurry vision, no xerophthalmia ENT: no sore throat, no nodules palpated in throat, no dysphagia/odynophagia, no hoarseness Cardiovascular: no CP/SOB/palpitations/leg swelling Respiratory: no cough/SOB Gastrointestinal: no N/V/D/C Musculoskeletal: no muscle/joint aches Skin: no rashes Neurological: no tremors/numbness/tingling/dizziness Psychiatric: no depression/anxiety  Objective:    BP 128/72   Pulse 78   Resp 18   Ht 5\' 2"  (1.575 m)   Wt 162 lb (73.5 kg)   SpO2 98%   BMI 29.63 kg/m   Wt Readings from Last 3 Encounters:  11/16/15 162 lb (73.5 kg)  05/16/15 156 lb (70.8 kg)  08/12/12 127 lb 12.8 oz (58 kg)    Physical Exam  Constitutional: overweight, in NAD Eyes: PERRLA, EOMI, no exophthalmos ENT: moist mucous membranes, no thyromegaly, no cervical lymphadenopathy Cardiovascular: RRR, No MRG Respiratory: CTA B Gastrointestinal: abdomen soft, NT, ND, BS+ Musculoskeletal: no deformities, strength intact in all 4 Skin: moist, warm, no rashes Neurological: no tremor with outstretched hands, DTR normal in all 4  CMP     Component Value Date/Time   NA 137 11/10/2015 1440   K 4.3 11/10/2015 1440   CL 101 11/10/2015 1440   CO2 26 11/10/2015 1440   GLUCOSE 149 (H) 11/10/2015 1440   BUN 13 11/10/2015 1440   CREATININE 0.74 11/10/2015 1440   CALCIUM  9.3 11/10/2015 1440   PROT 8.6 (H) 08/12/2012 1816   ALBUMIN 3.3 (L) 08/12/2012 1816   AST 22 08/12/2012 1816   ALT 36 (H) 08/12/2012 1816   ALKPHOS 142 (H) 08/12/2012 1816   BILITOT 0.2 (L) 08/12/2012 1816   GFRNONAA >90 08/12/2012 1816   GFRAA >90 08/12/2012 1816     Assessment & Plan:   1. Hypothyroidism, unspecified hypothyroidism type  Her  TFTs are c/w appropriate replacement. I will continue Synthroid 88 mcg po qam.  - We discussed about correct intake of levothyroxine, at fasting, with water, separated by at least 30 minutes from breakfast, and separated by more than 4 hours from calcium, iron, multivitamins, acid reflux medications (PPIs). -Patient is made aware of the fact that thyroid hormone replacement is needed for life, dose to be adjusted by periodic monitoring of thyroid function tests.     2. Essential hypertension, benign For HTN, I have cunseled her on salt restriction and , and will continue HCTZ 25 mcg po qam.  3. Hyperlipidemia - Patient is advised to avoid butter and fried food.  4. Vitamin D deficiency - She is status post therapy with  Vitamin D 50 K units weekly x 12 weeks. I will recheck her vitamin D before next visit.  5. Pre-diabetes  I will resume MTF  ER 500mg  po qday. Carbs and exercise regiment advised.    - 25 minutes of time was spent on the care of this patient , 50% of which was applied for counseling on diabetes complications and their preventions.  - I advised patient to maintain close follow up with Default, Provider, MD for primary care needs. Follow up plan: Return in about 6 months (around 05/15/2016) for follow up with pre-visit labs.  Marquis Lunch, MD Phone: [phone]  Fax: [phone]   11/16/2015, 10:43 AM

## 2016-05-04 ENCOUNTER — Other Ambulatory Visit: Payer: Self-pay | Admitting: "Endocrinology

## 2016-05-05 LAB — VITAMIN D 25 HYDROXY (VIT D DEFICIENCY, FRACTURES): VIT D 25 HYDROXY: 16 ng/mL — AB (ref 30–100)

## 2016-05-05 LAB — T4, FREE: FREE T4: 1.7 ng/dL (ref 0.8–1.8)

## 2016-05-05 LAB — HEMOGLOBIN A1C
Hgb A1c MFr Bld: 5.7 % — ABNORMAL HIGH (ref <5.7)
Mean Plasma Glucose: 117 mg/dL

## 2016-05-05 LAB — TSH: TSH: 0.11 mIU/L — ABNORMAL LOW

## 2016-05-09 ENCOUNTER — Encounter: Payer: Self-pay | Admitting: "Endocrinology

## 2016-05-09 ENCOUNTER — Ambulatory Visit (INDEPENDENT_AMBULATORY_CARE_PROVIDER_SITE_OTHER): Payer: BLUE CROSS/BLUE SHIELD | Admitting: "Endocrinology

## 2016-05-09 VITALS — BP 136/82 | HR 91 | Ht 62.0 in | Wt 158.0 lb

## 2016-05-09 DIAGNOSIS — R7303 Prediabetes: Secondary | ICD-10-CM

## 2016-05-09 DIAGNOSIS — E559 Vitamin D deficiency, unspecified: Secondary | ICD-10-CM

## 2016-05-09 DIAGNOSIS — I10 Essential (primary) hypertension: Secondary | ICD-10-CM

## 2016-05-09 DIAGNOSIS — E782 Mixed hyperlipidemia: Secondary | ICD-10-CM | POA: Diagnosis not present

## 2016-05-09 DIAGNOSIS — E038 Other specified hypothyroidism: Secondary | ICD-10-CM

## 2016-05-09 MED ORDER — VITAMIN D3 125 MCG (5000 UT) PO CAPS: 5000.0000 [IU] | ORAL_CAPSULE | Freq: Every day | ORAL | 0 refills | Status: DC

## 2016-05-09 MED ORDER — LEVOTHYROXINE SODIUM 75 MCG PO TABS: 75.0000 ug | ORAL_TABLET | Freq: Every day | ORAL | 6 refills | Status: DC

## 2016-05-09 NOTE — Progress Notes (Signed)
Subjective:    Patient ID: Renee Zamora, female    DOB: Jul 25, 1975, PCP Default, Provider, MD   History reviewed. No pertinent past medical history. History reviewed. No pertinent surgical history. Social History   Social History  . Marital status: Single    Spouse name: N/A  . Number of children: N/A  . Years of education: N/A   Social History Main Topics  . Smoking status: Never Smoker  . Smokeless tobacco: Never Used  . Alcohol use No  . Drug use: No  . Sexual activity: Yes    Birth control/ protection: Pill   Other Topics Concern  . None   Social History Narrative  . None   Outpatient Encounter Prescriptions as of 05/09/2016  Medication Sig  . Cholecalciferol (VITAMIN D3) 5000 units CAPS Take 1 capsule (5,000 Units total) by mouth daily.  . hydrochlorothiazide (HYDRODIURIL) 25 MG tablet Take 1 tablet (25 mg total) by mouth every morning.  Marland Kitchen levothyroxine (SYNTHROID, LEVOTHROID) 75 MCG tablet Take 1 tablet (75 mcg total) by mouth daily before breakfast.  . metFORMIN (GLUCOPHAGE XR) 500 MG 24 hr tablet Take 1 tablet (500 mg total) by mouth daily with breakfast.  . [DISCONTINUED] levothyroxine (SYNTHROID, LEVOTHROID) 88 MCG tablet Take 1 tablet (88 mcg total) by mouth daily before breakfast.   No facility-administered encounter medications on file as of 05/09/2016.    ALLERGIES: No Known Allergies VACCINATION STATUS:  There is no immunization history on file for this patient.  HPI [name] is a 41 - yr-old female with  diagnosis of Hashimoto thyroiditis with hypothyroidism, hypertension, prediabetes, and vitamin D deficiency. She was initiated on levothyroxine 88 mcg po qam. she tolerated better . she lost 4 pounds of weight. She has no new complaints today. Pt denies family history of thyroid dysfunction. she denies personal history of goiter.  Review of Systems Constitutional: + weight loss, no fatigue, no subjective hyperthermia/hypothermia Eyes: no blurry  vision, no xerophthalmia ENT: no sore throat, no nodules palpated in throat, no dysphagia/odynophagia, no hoarseness Cardiovascular: no CP/SOB/palpitations/leg swelling Respiratory: no cough/SOB Gastrointestinal: no N/V/D/C Musculoskeletal: no muscle/joint aches Skin: no rashes Neurological: no tremors/numbness/tingling/dizziness Psychiatric: no depression/anxiety  Objective:    BP 136/82   Pulse 91   Ht 5\' 2"  (1.575 m)   Wt 158 lb (71.7 kg)   BMI 28.90 kg/m   Wt Readings from Last 3 Encounters:  05/09/16 158 lb (71.7 kg)  11/16/15 162 lb (73.5 kg)  05/16/15 156 lb (70.8 kg)    Physical Exam  Constitutional:  overweight, in NAD Eyes: PERRLA, EOMI, no exophthalmos ENT: moist mucous membranes, no thyromegaly, no cervical lymphadenopathy Cardiovascular: RRR, No MRG Respiratory: CTA B Gastrointestinal: abdomen soft, NT, ND, BS+ Musculoskeletal: no deformities, strength intact in all 4 Skin: moist, warm, no rashes Neurological: no tremor with outstretched hands, DTR normal in all 4  CMP     Component Value Date/Time   NA 137 11/10/2015 1440   K 4.3 11/10/2015 1440   CL 101 11/10/2015 1440   CO2 26 11/10/2015 1440   GLUCOSE 149 (H) 11/10/2015 1440   BUN 13 11/10/2015 1440   CREATININE 0.74 11/10/2015 1440   CALCIUM 9.3 11/10/2015 1440   PROT 8.6 (H) 08/12/2012 1816   ALBUMIN 3.3 (L) 08/12/2012 1816   AST 22 08/12/2012 1816   ALT 36 (H) 08/12/2012 1816   ALKPHOS 142 (H) 08/12/2012 1816   BILITOT 0.2 (L) 08/12/2012 1816   GFRNONAA >90 08/12/2012 1816   GFRAA >  90 08/12/2012 1816     Assessment & Plan:   1. Hypothyroidism Her TFTs are c/w over- replacement. I will lower Synthroid to 75 g by mouth every morning.   - We discussed about correct intake of levothyroxine, at fasting, with water, separated by at least 30 minutes from breakfast, and separated by more than 4 hours from calcium, iron, multivitamins, acid reflux medications (PPIs). -Patient is made aware of  the fact that thyroid hormone replacement is needed for life, dose to be adjusted by periodic monitoring of thyroid function tests.     2. Essential hypertension, benign For HTN, I have cunseled her on salt restriction and , and will continue HCTZ 25 mcg po qam.  3. Hyperlipidemia - Patient is advised to avoid butter and fried food.  4. Vitamin D deficiency - She is status post therapy with  Vitamin D 50 K units weekly x 12 weeks. Her vitamin D still low at 18. I will prescribed vitamin D3 5000 units daily for the next 90 days.   5. Pre-diabetes - A1c better at 5.7%. I will resume MTF  ER 500mg  po qday. Carbs and exercise regiment advised.    - 25 minutes of time was spent on the care of this patient , 50% of which was applied for counseling on diabetes complications and their preventions.  - I advised patient to maintain close follow up with Default, Provider, MD for primary care needs. Follow up plan: Return in about 6 months (around 11/09/2016) for follow up with pre-visit labs.  Marquis LunchGebre Basil Blakesley, MD Phone: [phone]  Fax: [phone]   05/09/2016, 4:09 PM

## 2016-05-18 ENCOUNTER — Other Ambulatory Visit: Payer: Self-pay | Admitting: "Endocrinology

## 2016-06-18 ENCOUNTER — Other Ambulatory Visit: Payer: Self-pay | Admitting: "Endocrinology

## 2016-06-19 MED ORDER — LEVOTHYROXINE SODIUM 75 MCG PO TABS: 75.0000 ug | ORAL_TABLET | Freq: Every day | ORAL | 3 refills | Status: DC

## 2016-10-16 ENCOUNTER — Other Ambulatory Visit: Payer: Self-pay | Admitting: "Endocrinology

## 2016-11-09 LAB — TSH: TSH: 0.66 m[IU]/L

## 2016-11-09 LAB — T4, FREE: Free T4: 1.6 ng/dL (ref 0.8–1.8)

## 2016-11-15 ENCOUNTER — Encounter: Payer: Self-pay | Admitting: "Endocrinology

## 2016-11-15 ENCOUNTER — Ambulatory Visit (INDEPENDENT_AMBULATORY_CARE_PROVIDER_SITE_OTHER): Payer: BLUE CROSS/BLUE SHIELD | Admitting: "Endocrinology

## 2016-11-15 VITALS — BP 127/83 | HR 83 | Ht 62.0 in | Wt 161.0 lb

## 2016-11-15 DIAGNOSIS — E782 Mixed hyperlipidemia: Secondary | ICD-10-CM | POA: Diagnosis not present

## 2016-11-15 DIAGNOSIS — R7303 Prediabetes: Secondary | ICD-10-CM | POA: Diagnosis not present

## 2016-11-15 DIAGNOSIS — E038 Other specified hypothyroidism: Secondary | ICD-10-CM

## 2016-11-15 DIAGNOSIS — I10 Essential (primary) hypertension: Secondary | ICD-10-CM

## 2016-11-15 MED ORDER — METFORMIN HCL ER 500 MG PO TB24: ORAL_TABLET | ORAL | 6 refills | Status: DC

## 2016-11-15 MED ORDER — VITAMIN D3 125 MCG (5000 UT) PO CAPS: 5000.0000 [IU] | ORAL_CAPSULE | Freq: Every day | ORAL | 0 refills | Status: DC

## 2016-11-15 MED ORDER — LEVOTHYROXINE SODIUM 75 MCG PO TABS: 75.0000 ug | ORAL_TABLET | Freq: Every day | ORAL | 6 refills | Status: DC

## 2016-11-15 MED ORDER — HYDROCHLOROTHIAZIDE 25 MG PO TABS: 25.0000 mg | ORAL_TABLET | Freq: Every morning | ORAL | 6 refills | Status: DC

## 2016-11-15 NOTE — Progress Notes (Signed)
Subjective:    Patient ID: Renee Zamora, female    DOB: 02/14/1976, PCP Default, Provider, MD   Past Medical History:  Diagnosis Date  . Essential hypertension, benign   . Hypothyroidism (acquired)   . Mixed hyperlipidemia   . Vitamin D deficiency    No past surgical history on file. Social History   Social History  . Marital status: Single    Spouse name: N/A  . Number of children: N/A  . Years of education: N/A   Social History Main Topics  . Smoking status: Never Smoker  . Smokeless tobacco: Never Used  . Alcohol use No  . Drug use: No  . Sexual activity: Yes    Birth control/ protection: Pill   Other Topics Concern  . None   Social History Narrative  . None   Outpatient Encounter Prescriptions as of 11/15/2016  Medication Sig  . Cholecalciferol (VITAMIN D3) 5000 units CAPS Take 1 capsule (5,000 Units total) by mouth daily.  . hydrochlorothiazide (HYDRODIURIL) 25 MG tablet Take 1 tablet (25 mg total) by mouth every morning.  Marland Kitchen. levothyroxine (SYNTHROID, LEVOTHROID) 75 MCG tablet Take 1 tablet (75 mcg total) by mouth daily before breakfast.  . metFORMIN (GLUCOPHAGE-XR) 500 MG 24 hr tablet TAKE 1 TABLET BY MOUTH ONCE DAILY WITH BREAKFAST  . Vitamin D, Ergocalciferol, (DRISDOL) 50000 units CAPS capsule TAKE 1 CAPSULE BY MOUTH ONCE A WEEK  . [DISCONTINUED] Cholecalciferol (VITAMIN D3) 5000 units CAPS Take 1 capsule (5,000 Units total) by mouth daily.  . [DISCONTINUED] hydrochlorothiazide (HYDRODIURIL) 25 MG tablet TAKE 1 TABLET BY MOUTH IN THE MORNING  . [DISCONTINUED] levothyroxine (SYNTHROID, LEVOTHROID) 75 MCG tablet Take 1 tablet (75 mcg total) by mouth daily before breakfast.   No facility-administered encounter medications on file as of 11/15/2016.    ALLERGIES: No Known Allergies VACCINATION STATUS:  There is no immunization history on file for this patient.  HPI Mrs. Renee Zamora is a 41 - yr-old female with  diagnosis of Hashimoto thyroiditis with  hypothyroidism, hypertension, prediabetes, and vitamin D deficiency. She was initiated on levothyroxine 75 mcg po qam. she tolerated better . she has steady weight. She has no new complaints today. Pt denies family history of thyroid dysfunction. she denies personal history of goiter.  Review of Systems Constitutional: + steady weight , no fatigue, no subjective hyperthermia/hypothermia Eyes: no blurry vision, no xerophthalmia ENT: no sore throat, no nodules palpated in throat, no dysphagia/odynophagia, no hoarseness Cardiovascular: no CP/SOB/palpitations/leg swelling Respiratory: no cough/SOB Gastrointestinal: no N/V/D/C Musculoskeletal: no muscle/joint aches Skin: no rashes Neurological: no tremors/numbness/tingling/dizziness Psychiatric: no depression/anxiety  Objective:    BP 127/83   Pulse 83   Ht 5\' 2"  (1.575 m)   Wt 161 lb (73 kg)   BMI 29.45 kg/m   Wt Readings from Last 3 Encounters:  11/15/16 161 lb (73 kg)  05/09/16 158 lb (71.7 kg)  11/16/15 162 lb (73.5 kg)    Physical Exam  Constitutional:  overweight, in NAD Eyes: PERRLA, EOMI, no exophthalmos ENT: moist mucous membranes, no thyromegaly, no cervical lymphadenopathy Cardiovascular: RRR, No MRG Respiratory: CTA B Gastrointestinal: abdomen soft, NT, ND, BS+ Musculoskeletal: no deformities, strength intact in all 4 Skin: moist, warm, no rashes Neurological: no tremor with outstretched hands, DTR normal in all 4  Recent Results (from the past 2160 hour(s))  T4, free     Status: None   Collection Time: 11/09/16  3:17 PM  Result Value Ref Range   Free T4 1.6 0.8 -  1.8 ng/dL  TSH     Status: None   Collection Time: 11/09/16  3:17 PM  Result Value Ref Range   TSH 0.66 mIU/L    Comment:           Reference Range .           > or = 20 Years  0.40-4.50 .                Pregnancy Ranges           First trimester    0.26-2.66           Second trimester   0.55-2.73           Third trimester    0.43-2.91        Assessment & Plan:   1. Hypothyroidism Due to Hashimoto's thyroiditis Her thyroid function tests are  consistent with appropriate replacement. I will continue  Synthroid 75 g by mouth every morning.   - We discussed about correct intake of levothyroxine, at fasting, with water, separated by at least 30 minutes from breakfast, and separated by more than 4 hours from calcium, iron, multivitamins, acid reflux medications (PPIs). -Patient is made aware of the fact that thyroid hormone replacement is needed for life, dose to be adjusted by periodic monitoring of thyroid function tests.     2. Essential hypertension, benign For hypertension, I have cunseled her on salt restriction and , and will continue  Hydrochlorothiazide 25 mcg po qam.  3. Hyperlipidemia - Patient is advised to avoid butter and fried food. She will have fasting lipid panel before next visit.  4. Vitamin D deficiency - She is status post therapy with  Vitamin D 50 K units weekly x 12 weeks. Her vitamin D still low at 18. I will prescribed vitamin D3 5000 units daily for the next 90 days.   5. Pre-diabetes - A1c better at 5.7%. I will resume MTF  ER  po qday. Carbs and exercise regiment advised.  - I advised patient to maintain close follow up with Default, Provider, MD for primary care needs. Follow up plan: Return in about 6 months (around 05/15/2017) for follow up with pre-visit labs.  Marquis Lunch, MD Phone: [phone]  Fax: [phone]  This note was partially dictated with voice recognition software. Similar sounding words can be transcribed inadequately or may not  be corrected upon review.  11/15/2016, 4:11 PM

## 2017-02-08 ENCOUNTER — Other Ambulatory Visit: Payer: Self-pay | Admitting: "Endocrinology

## 2017-05-13 LAB — COMPLETE METABOLIC PANEL WITH GFR
AG RATIO: 1.5 (calc) (ref 1.0–2.5)
ALKALINE PHOSPHATASE (APISO): 59 U/L (ref 33–115)
ALT: 13 U/L (ref 6–29)
AST: 12 U/L (ref 10–30)
Albumin: 4.1 g/dL (ref 3.6–5.1)
BILIRUBIN TOTAL: 0.7 mg/dL (ref 0.2–1.2)
BUN: 14 mg/dL (ref 7–25)
CHLORIDE: 103 mmol/L (ref 98–110)
CO2: 28 mmol/L (ref 20–32)
Calcium: 9 mg/dL (ref 8.6–10.2)
Creat: 0.68 mg/dL (ref 0.50–1.10)
GFR, EST AFRICAN AMERICAN: 125 mL/min/{1.73_m2} (ref >=60)
GFR, Est Non African American: 108 mL/min/{1.73_m2} (ref >=60)
GLUCOSE: 96 mg/dL (ref 65–99)
Globulin: 2.8 g/dL (calc) (ref 1.9–3.7)
POTASSIUM: 3.6 mmol/L (ref 3.5–5.3)
Sodium: 138 mmol/L (ref 135–146)
TOTAL PROTEIN: 6.9 g/dL (ref 6.1–8.1)

## 2017-05-13 LAB — LIPID PANEL
Cholesterol: 211 mg/dL — ABNORMAL HIGH (ref <200)
HDL: 50 mg/dL — ABNORMAL LOW (ref >50)
LDL CHOLESTEROL (CALC): 135 mg/dL — AB
Non-HDL Cholesterol (Calc): 161 mg/dL (calc) — ABNORMAL HIGH (ref <130)
TRIGLYCERIDES: 134 mg/dL (ref <150)
Total CHOL/HDL Ratio: 4.2 (calc) (ref <5.0)

## 2017-05-13 LAB — HEMOGLOBIN A1C
EAG (MMOL/L): 6.6 (calc)
Hgb A1c MFr Bld: 5.8 % of total Hgb — ABNORMAL HIGH (ref <5.7)
Mean Plasma Glucose: 120 (calc)

## 2017-05-13 LAB — T4, FREE: Free T4: 1.5 ng/dL (ref 0.8–1.8)

## 2017-05-13 LAB — TSH: TSH: 0.9 mIU/L

## 2017-05-13 LAB — VITAMIN D 25 HYDROXY (VIT D DEFICIENCY, FRACTURES): Vit D, 25-Hydroxy: 21 ng/mL — ABNORMAL LOW (ref 30–100)

## 2017-05-16 ENCOUNTER — Encounter: Payer: Self-pay | Admitting: "Endocrinology

## 2017-05-16 ENCOUNTER — Ambulatory Visit: Payer: BLUE CROSS/BLUE SHIELD | Admitting: "Endocrinology

## 2017-05-16 VITALS — BP 127/80 | HR 74 | Ht 62.0 in | Wt 159.0 lb

## 2017-05-16 DIAGNOSIS — R7303 Prediabetes: Secondary | ICD-10-CM | POA: Diagnosis not present

## 2017-05-16 DIAGNOSIS — E063 Autoimmune thyroiditis: Secondary | ICD-10-CM

## 2017-05-16 DIAGNOSIS — E782 Mixed hyperlipidemia: Secondary | ICD-10-CM | POA: Diagnosis not present

## 2017-05-16 DIAGNOSIS — E038 Other specified hypothyroidism: Secondary | ICD-10-CM | POA: Diagnosis not present

## 2017-05-16 DIAGNOSIS — I10 Essential (primary) hypertension: Secondary | ICD-10-CM | POA: Diagnosis not present

## 2017-05-16 MED ORDER — METFORMIN HCL ER 500 MG PO TB24: ORAL_TABLET | ORAL | 1 refills | Status: DC

## 2017-05-16 MED ORDER — HYDROCHLOROTHIAZIDE 25 MG PO TABS: 25.0000 mg | ORAL_TABLET | Freq: Every morning | ORAL | 1 refills | Status: DC

## 2017-05-16 MED ORDER — LEVOTHYROXINE SODIUM 75 MCG PO TABS: 75.0000 ug | ORAL_TABLET | Freq: Every day | ORAL | 1 refills | Status: DC

## 2017-05-16 MED ORDER — ROSUVASTATIN CALCIUM 5 MG PO TABS: 5.0000 mg | ORAL_TABLET | Freq: Every day | ORAL | 1 refills | Status: DC

## 2017-05-16 NOTE — Progress Notes (Signed)
Subjective:    Patient ID: Renee Zamora, female    DOB: Aug 23, 1975, PCP Default, Provider, MD   Past Medical History:  Diagnosis Date  . Essential hypertension, benign   . Hypothyroidism (acquired)   . Mixed hyperlipidemia   . Prediabetes   . Vitamin D deficiency    History reviewed. No pertinent surgical history. Social History   Socioeconomic History  . Marital status: Single    Spouse name: None  . Number of children: None  . Years of education: None  . Highest education level: None  Social Needs  . Financial resource strain: None  . Food insecurity - worry: None  . Food insecurity - inability: None  . Transportation needs - medical: None  . Transportation needs - non-medical: None  Occupational History  . None  Tobacco Use  . Smoking status: Never Smoker  . Smokeless tobacco: Never Used  Substance and Sexual Activity  . Alcohol use: No  . Drug use: No  . Sexual activity: Yes    Birth control/protection: Pill  Other Topics Concern  . None  Social History Narrative  . None   Outpatient Encounter Medications as of 05/16/2017  Medication Sig  . hydrochlorothiazide (HYDRODIURIL) 25 MG tablet Take 1 tablet (25 mg total) by mouth every morning.  Marland Kitchen levothyroxine (SYNTHROID, LEVOTHROID) 75 MCG tablet Take 1 tablet (75 mcg total) by mouth daily before breakfast.  . metFORMIN (GLUCOPHAGE-XR) 500 MG 24 hr tablet TAKE 1 TABLET BY MOUTH ONCE DAILY WITH BREAKFAST  . rosuvastatin (CRESTOR) 5 MG tablet Take 1 tablet (5 mg total) by mouth daily.  . [DISCONTINUED] Cholecalciferol (VITAMIN D3) 5000 units CAPS Take 1 capsule (5,000 Units total) by mouth daily.  . [DISCONTINUED] hydrochlorothiazide (HYDRODIURIL) 25 MG tablet Take 1 tablet (25 mg total) by mouth every morning.  . [DISCONTINUED] levothyroxine (SYNTHROID, LEVOTHROID) 75 MCG tablet Take 1 tablet (75 mcg total) by mouth daily before breakfast.  . [DISCONTINUED] metFORMIN (GLUCOPHAGE-XR) 500 MG 24 hr tablet TAKE 1  TABLET BY MOUTH ONCE DAILY WITH BREAKFAST  . [DISCONTINUED] Vitamin D, Ergocalciferol, (DRISDOL) 50000 units CAPS capsule TAKE 1 CAPSULE BY MOUTH ONCE A WEEK   No facility-administered encounter medications on file as of 05/16/2017.    ALLERGIES: No Known Allergies VACCINATION STATUS:  There is no immunization history on file for this patient.  HPI Renee Zamora is a 42 - yr-old female with  diagnosis of Hashimoto thyroiditis with hypothyroidism, hypertension, hyperlipidemia,  prediabetes, and vitamin D deficiency. She was initiated on levothyroxine 75 mcg po qam. she tolerated this medication better . she has steady weight. She has no new complaints today. Pt denies family history of thyroid dysfunction. she denies personal history of goiter.  Review of Systems Constitutional: + steady weight , no fatigue, no subjective hyperthermia/hypothermia Eyes: no blurry vision, no xerophthalmia ENT: no sore throat, no nodules palpated in throat, no dysphagia/odynophagia, no hoarseness Cardiovascular: no CP/SOB/palpitations/leg swelling Respiratory: no cough/SOB Gastrointestinal: no N/V/D/C Musculoskeletal: no muscle/joint aches Skin: no rashes Neurological: no tremors/numbness/tingling/dizziness Psychiatric: no depression/anxiety  Objective:    BP 127/80   Pulse 74   Ht 5\' 2"  (1.575 m)   Wt 159 lb (72.1 kg)   BMI 29.08 kg/m   Wt Readings from Last 3 Encounters:  05/16/17 159 lb (72.1 kg)  11/15/16 161 lb (73 kg)  05/09/16 158 lb (71.7 kg)    Physical Exam  Constitutional:  overweight, not in acute distress. Eyes: PERRLA, EOMI, no exophthalmos ENT: moist mucous membranes,  no thyromegaly, no cervical lymphadenopathy Musculoskeletal: no deformities, strength intact in all 4 Skin: moist, warm, no rashes Neurological: no tremor with outstretched hands, DTR normal in all 4  Recent Results (from the past 2160 hour(s))  T4, free     Status: None   Collection Time: 05/11/17  8:47 AM   Result Value Ref Range   Free T4 1.5 0.8 - 1.8 ng/dL  TSH     Status: None   Collection Time: 05/11/17  8:47 AM  Result Value Ref Range   TSH 0.90 mIU/L    Comment:           Reference Range .           > or = 20 Years  0.40-4.50 .                Pregnancy Ranges           First trimester    0.26-2.66           Second trimester   0.55-2.73           Third trimester    0.43-2.91   Hemoglobin A1c     Status: Abnormal   Collection Time: 05/11/17  8:47 AM  Result Value Ref Range   Hgb A1c MFr Bld 5.8 (H) <5.7 % of total Hgb    Comment: For someone without known diabetes, a hemoglobin  A1c value between 5.7% and 6.4% is consistent with prediabetes and should be confirmed with a  follow-up test. . For someone with known diabetes, a value <7% indicates that their diabetes is well controlled. A1c targets should be individualized based on duration of diabetes, age, comorbid conditions, and other considerations. . This assay result is consistent with an increased risk of diabetes. . Currently, no consensus exists regarding use of hemoglobin A1c for diagnosis of diabetes for children. .    Mean Plasma Glucose 120 (calc)   eAG (mmol/L) 6.6 (calc)  COMPLETE METABOLIC PANEL WITH GFR     Status: None   Collection Time: 05/11/17  8:47 AM  Result Value Ref Range   Glucose, Bld 96 65 - 99 mg/dL    Comment: .            Fasting reference interval .    BUN 14 7 - 25 mg/dL   Creat 2.44 0.10 - 2.72 mg/dL   GFR, Est Non African American 108 > OR = 60 mL/min/1.62m2   GFR, Est African American 125 > OR = 60 mL/min/1.30m2   BUN/Creatinine Ratio NOT APPLICABLE 6 - 22 (calc)   Sodium 138 135 - 146 mmol/L   Potassium 3.6 3.5 - 5.3 mmol/L   Chloride 103 98 - 110 mmol/L   CO2 28 20 - 32 mmol/L   Calcium 9.0 8.6 - 10.2 mg/dL   Total Protein 6.9 6.1 - 8.1 g/dL   Albumin 4.1 3.6 - 5.1 g/dL   Globulin 2.8 1.9 - 3.7 g/dL (calc)   AG Ratio 1.5 1.0 - 2.5 (calc)   Total Bilirubin 0.7 0.2 -  1.2 mg/dL   Alkaline phosphatase (APISO) 59 33 - 115 U/L   AST 12 10 - 30 U/L   ALT 13 6 - 29 U/L  VITAMIN D 25 Hydroxy (Vit-D Deficiency, Fractures)     Status: Abnormal   Collection Time: 05/11/17  8:47 AM  Result Value Ref Range   Vit D, 25-Hydroxy 21 (L) 30 - 100 ng/mL    Comment: Vitamin D Status  25-OH Vitamin D: . Deficiency:                    <20 ng/mL Insufficiency:             20 - 29 ng/mL Optimal:                 > or = 30 ng/mL . For 25-OH Vitamin D testing on patients on  D2-supplementation and patients for whom quantitation  of D2 and D3 fractions is required, the [facility]ureD(TM) 25-OH VIT D, (D2,D3), LC/MS/MS is recommended: order  code [id number] (patients >6842yrs). . For more information on this test, go to: http://education.[facility].com/faq/FAQ163 (This link is being provided for  informational/educational purposes only.)   Lipid panel     Status: Abnormal   Collection Time: 05/11/17  8:47 AM  Result Value Ref Range   Cholesterol 211 (H) <200 mg/dL   HDL 50 (L) >19>50 mg/dL   Triglycerides 147134 <829<150 mg/dL   LDL Cholesterol (Calc) 135 (H) mg/dL (calc)    Comment: Reference range: <100 . Desirable range <100 mg/dL for primary prevention;   <70 mg/dL for patients with CHD or diabetic patients  with > or = 2 CHD risk factors. Marland Kitchen. LDL-C is now calculated using the Martin-Hopkins  calculation, which is a validated novel method providing  better accuracy than the Friedewald equation in the  estimation of LDL-C.  Horald PollenMartin SS et al. Lenox AhrJAMA. 5621;308(652013;310(19): 2061-2068  (http://education.[facility].com/faq/FAQ164)    Total CHOL/HDL Ratio 4.2 <5.0 (calc)   Non-HDL Cholesterol (Calc) 161 (H) <130 mg/dL (calc)    Comment: For patients with diabetes plus 1 major ASCVD risk  factor, treating to a non-HDL-C goal of <100 mg/dL  (LDL-C of <78<70 mg/dL) is considered a therapeutic  option.       Assessment & Plan:   1. Hypothyroidism Due to Hashimoto's  thyroiditis Her thyroid function tests are  consistent with appropriate replacement. I will continue  Synthroid 75 g by mouth every morning.   - We discussed about correct intake of levothyroxine, at fasting, with water, separated by at least 30 minutes from breakfast, and separated by more than 4 hours from calcium, iron, multivitamins, acid reflux medications (PPIs). -Patient is made aware of the fact that thyroid hormone replacement is needed for life, dose to be adjusted by periodic monitoring of thyroid function tests.     2. Essential hypertension, benign For hypertension, I have cunseled her on salt restriction and , and will continue HCTZ 25 mcg po qam.  3. Hyperlipidemia - Her LDL is higher at 135. She is approached for low dose statin, crestor 5mg  po qhs. SE and precautions discussed with her.   4. Vitamin D deficiency - She is status post therapy with  Vitamin D 50 K units weekly x 12 weeks. Her vitamin D has improved to 21, she is advised to take vitamin D 3 2000 units qday.    5. Pre-diabetes - A1c is stable at 5.8%. I advised her to continue  MTF  ER 500mg  po qday.  -  Suggestion is made for her to avoid simple carbohydrates  from her diet including Cakes, Sweet Desserts / Pastries, Ice Cream, Soda (diet and regular), Sweet Tea, Candies, Chips, Cookies, Store Bought Juices, Alcohol in Excess of  1-2 drinks a day, Artificial Sweeteners, and "Sugar-free" Products. This will help patient to have stable blood glucose profile and potentially avoid unintended weight gain.   Carbs and exercise regiment advised.  - I  advised patient to maintain close follow up with PCP   for primary care needs. - Time spent with the patient: 25 min, of which >50% was spent in reviewing her  current and  previous labs, previous treatments, and medications doses and developing a plan for long-term care.  Renee Zamora participated in the discussions, expressed understanding, and voiced agreement  with the above plans.  All questions were answered to her satisfaction. she is encouraged to contact clinic should she have any questions or concerns prior to her return visit.  Follow up plan: Return in about 6 months (around 11/16/2017) for follow up with pre-visit labs.  Marquis Lunch, MD Phone: [phone]  Fax: [phone]  This note was partially dictated with voice recognition software. Similar sounding words can be transcribed inadequately or may not  be corrected upon review.  05/16/2017, 7:42 PM

## 2017-11-10 ENCOUNTER — Other Ambulatory Visit: Payer: Self-pay | Admitting: "Endocrinology

## 2017-11-21 ENCOUNTER — Ambulatory Visit: Payer: BLUE CROSS/BLUE SHIELD | Admitting: "Endocrinology

## 2017-12-17 LAB — COMPLETE METABOLIC PANEL WITH GFR
AG RATIO: 1.6 (calc) (ref 1.0–2.5)
ALBUMIN MSPROF: 4.2 g/dL (ref 3.6–5.1)
ALT: 19 U/L (ref 6–29)
AST: 16 U/L (ref 10–30)
Alkaline phosphatase (APISO): 65 U/L (ref 33–115)
BUN: 14 mg/dL (ref 7–25)
CALCIUM: 9.5 mg/dL (ref 8.6–10.2)
CO2: 30 mmol/L (ref 20–32)
CREATININE: 0.63 mg/dL (ref 0.50–1.10)
Chloride: 101 mmol/L (ref 98–110)
GFR, EST NON AFRICAN AMERICAN: 111 mL/min/{1.73_m2} (ref >=60)
GFR, Est African American: 128 mL/min/{1.73_m2} (ref >=60)
GLOBULIN: 2.7 g/dL (ref 1.9–3.7)
Glucose, Bld: 168 mg/dL — ABNORMAL HIGH (ref 65–139)
POTASSIUM: 3.8 mmol/L (ref 3.5–5.3)
SODIUM: 139 mmol/L (ref 135–146)
Total Bilirubin: 0.7 mg/dL (ref 0.2–1.2)
Total Protein: 6.9 g/dL (ref 6.1–8.1)

## 2017-12-17 LAB — T4, FREE: Free T4: 1.2 ng/dL (ref 0.8–1.8)

## 2017-12-17 LAB — HEMOGLOBIN A1C
HEMOGLOBIN A1C: 6.1 %{Hb} — AB (ref <5.7)
MEAN PLASMA GLUCOSE: 128 (calc)
eAG (mmol/L): 7.1 (calc)

## 2017-12-17 LAB — TSH: TSH: 0.53 m[IU]/L

## 2017-12-30 ENCOUNTER — Ambulatory Visit: Payer: BLUE CROSS/BLUE SHIELD | Admitting: "Endocrinology

## 2017-12-30 ENCOUNTER — Encounter: Payer: Self-pay | Admitting: "Endocrinology

## 2017-12-30 VITALS — BP 115/76 | HR 71 | Ht 62.0 in | Wt 159.0 lb

## 2017-12-30 DIAGNOSIS — I10 Essential (primary) hypertension: Secondary | ICD-10-CM

## 2017-12-30 DIAGNOSIS — E038 Other specified hypothyroidism: Secondary | ICD-10-CM

## 2017-12-30 DIAGNOSIS — R7303 Prediabetes: Secondary | ICD-10-CM | POA: Diagnosis not present

## 2017-12-30 DIAGNOSIS — E782 Mixed hyperlipidemia: Secondary | ICD-10-CM | POA: Diagnosis not present

## 2017-12-30 DIAGNOSIS — E063 Autoimmune thyroiditis: Secondary | ICD-10-CM

## 2017-12-30 MED ORDER — LEVOTHYROXINE SODIUM 75 MCG PO TABS: 75.0000 ug | ORAL_TABLET | Freq: Every day | ORAL | 1 refills | Status: DC

## 2017-12-30 MED ORDER — HYDROCHLOROTHIAZIDE 25 MG PO TABS: 25.0000 mg | ORAL_TABLET | Freq: Every morning | ORAL | 1 refills | Status: DC

## 2017-12-30 MED ORDER — METFORMIN HCL ER 500 MG PO TB24: ORAL_TABLET | ORAL | 1 refills | Status: DC

## 2017-12-30 MED ORDER — ROSUVASTATIN CALCIUM 5 MG PO TABS: 5.0000 mg | ORAL_TABLET | Freq: Every day | ORAL | 1 refills | Status: DC

## 2017-12-30 NOTE — Progress Notes (Signed)
Endocrinology follow-up note    Subjective:    Patient ID: Renee Zamora, female    DOB: 1975-04-29, PCP Default, Provider, MD   Past Medical History:  Diagnosis Date  . Essential hypertension, benign   . Hypothyroidism (acquired)   . Mixed hyperlipidemia   . Prediabetes   . Vitamin D deficiency    History reviewed. No pertinent surgical history. Social History   Socioeconomic History  . Marital status: Single    Spouse name: Not on file  . Number of children: Not on file  . Years of education: Not on file  . Highest education level: Not on file  Occupational History  . Not on file  Social Needs  . Financial resource strain: Not on file  . Food insecurity:    Worry: Not on file    Inability: Not on file  . Transportation needs:    Medical: Not on file    Non-medical: Not on file  Tobacco Use  . Smoking status: Never Smoker  . Smokeless tobacco: Never Used  Substance and Sexual Activity  . Alcohol use: No  . Drug use: No  . Sexual activity: Yes    Birth control/protection: Pill  Lifestyle  . Physical activity:    Days per week: Not on file    Minutes per session: Not on file  . Stress: Not on file  Relationships  . Social connections:    Talks on phone: Not on file    Gets together: Not on file    Attends religious service: Not on file    Active member of club or organization: Not on file    Attends meetings of clubs or organizations: Not on file    Relationship status: Not on file  Other Topics Concern  . Not on file  Social History Narrative  . Not on file   Outpatient Encounter Medications as of 12/30/2017  Medication Sig  . hydrochlorothiazide (HYDRODIURIL) 25 MG tablet Take 1 tablet (25 mg total) by mouth every morning.  Marland Kitchen levothyroxine (SYNTHROID, LEVOTHROID) 75 MCG tablet Take 1 tablet (75 mcg total) by mouth daily before breakfast.  . metFORMIN (GLUCOPHAGE-XR) 500 MG 24 hr tablet TAKE 1 TABLET BY MOUTH ONCE DAILY WITH BREAKFAST  . rosuvastatin  (CRESTOR) 5 MG tablet Take 1 tablet (5 mg total) by mouth daily.  . [DISCONTINUED] hydrochlorothiazide (HYDRODIURIL) 25 MG tablet Take 1 tablet (25 mg total) by mouth every morning.  . [DISCONTINUED] levothyroxine (SYNTHROID, LEVOTHROID) 75 MCG tablet Take 1 tablet (75 mcg total) by mouth daily before breakfast.  . [DISCONTINUED] metFORMIN (GLUCOPHAGE-XR) 500 MG 24 hr tablet TAKE 1 TABLET BY MOUTH ONCE DAILY WITH BREAKFAST  . [DISCONTINUED] rosuvastatin (CRESTOR) 5 MG tablet TAKE 1 TABLET BY MOUTH ONCE DAILY   No facility-administered encounter medications on file as of 12/30/2017.    ALLERGIES: No Known Allergies VACCINATION STATUS:  There is no immunization history on file for this patient.  HPI Mrs. Matusek is a 42 - yr-old female with  diagnosis of Hashimoto thyroiditis with hypothyroidism, hypertension, hyperlipidemia,  prediabetes, and vitamin D deficiency. She was initiated on levothyroxine 75 mcg po qam. she tolerated this medication better . she has steady weight. She has no new complaints today. Pt denies family history of thyroid dysfunction. she denies personal history of goiter.  Review of Systems Constitutional: + steady weight , no fatigue, no subjective hyperthermia/hypothermia Eyes: no blurry vision, no xerophthalmia ENT: no sore throat, no nodules palpated in throat, no dysphagia/odynophagia, no hoarseness Cardiovascular:  no CP/SOB/palpitations/leg swelling  Musculoskeletal: no muscle/joint aches Skin: no rashes Neurological: no tremors/numbness/tingling/dizziness Psychiatric: no depression/anxiety  Objective:    BP 115/76   Pulse 71   Ht 5\' 2"  (1.575 m)   Wt 159 lb (72.1 kg)   BMI 29.08 kg/m   Wt Readings from Last 3 Encounters:  12/30/17 159 lb (72.1 kg)  05/16/17 159 lb (72.1 kg)  11/15/16 161 lb (73 kg)    Physical Exam  Constitutional:  overweight, not in acute distress. Eyes: PERRLA, EOMI, no exophthalmos ENT: moist mucous membranes, no  thyromegaly, no cervical lymphadenopathy Musculoskeletal: no deformities, strength intact in all 4 Skin: moist, warm, no rashes Neurological: no tremor with outstretched hands   Recent Results (from the past 2160 hour(s))  COMPLETE METABOLIC PANEL WITH GFR     Status: Abnormal   Collection Time: 12/16/17 10:45 AM  Result Value Ref Range   Glucose, Bld 168 (H) 65 - 139 mg/dL    Comment: .        Non-fasting reference interval .    BUN 14 7 - 25 mg/dL   Creat 1.61 0.96 - 0.45 mg/dL   GFR, Est Non African American 111 > OR = 60 mL/min/1.42m2   GFR, Est African American 128 > OR = 60 mL/min/1.53m2   BUN/Creatinine Ratio NOT APPLICABLE 6 - 22 (calc)   Sodium 139 135 - 146 mmol/L   Potassium 3.8 3.5 - 5.3 mmol/L   Chloride 101 98 - 110 mmol/L   CO2 30 20 - 32 mmol/L   Calcium 9.5 8.6 - 10.2 mg/dL   Total Protein 6.9 6.1 - 8.1 g/dL   Albumin 4.2 3.6 - 5.1 g/dL   Globulin 2.7 1.9 - 3.7 g/dL (calc)   AG Ratio 1.6 1.0 - 2.5 (calc)   Total Bilirubin 0.7 0.2 - 1.2 mg/dL   Alkaline phosphatase (APISO) 65 33 - 115 U/L   AST 16 10 - 30 U/L   ALT 19 6 - 29 U/L  Hemoglobin A1c     Status: Abnormal   Collection Time: 12/16/17 10:45 AM  Result Value Ref Range   Hgb A1c MFr Bld 6.1 (H) <5.7 % of total Hgb    Comment: For someone without known diabetes, a hemoglobin  A1c value between 5.7% and 6.4% is consistent with prediabetes and should be confirmed with a  follow-up test. . For someone with known diabetes, a value <7% indicates that their diabetes is well controlled. A1c targets should be individualized based on duration of diabetes, age, comorbid conditions, and other considerations. . This assay result is consistent with an increased risk of diabetes. . Currently, no consensus exists regarding use of hemoglobin A1c for diagnosis of diabetes for children. .    Mean Plasma Glucose 128 (calc)   eAG (mmol/L) 7.1 (calc)  TSH     Status: None   Collection Time: 12/16/17 10:45 AM   Result Value Ref Range   TSH 0.53 mIU/L    Comment:           Reference Range .           > or = 20 Years  0.40-4.50 .                Pregnancy Ranges           First trimester    0.26-2.66           Second trimester   0.55-2.73  Third trimester    0.43-2.91   T4, free     Status: None   Collection Time: 12/16/17 10:45 AM  Result Value Ref Range   Free T4 1.2 0.8 - 1.8 ng/dL      Assessment & Plan:   1. Hypothyroidism Due to Hashimoto's thyroiditis Her thyroid function tests are  consistent with appropriate replacement.  He is advised to continue Synthroid 75 mcg p.o. every morning.    - We discussed about correct intake of levothyroxine, at fasting, with water, separated by at least 30 minutes from breakfast, and separated by more than 4 hours from calcium, iron, multivitamins, acid reflux medications (PPIs). -Patient is made aware of the fact that thyroid hormone replacement is needed for life, dose to be adjusted by periodic monitoring of thyroid function tests.   2. Essential hypertension, benign For hypertension, I have cunseled her on salt restriction and , and will continue HCTZ 25 mcg po qam.  3. Hyperlipidemia - Her LDL is higher at 135. She is approached for low dose statin, crestor 5mg  po qhs. SE and precautions discussed with her.   4. Vitamin D deficiency - She is status post therapy with  Vitamin D 50 K units weekly x 12 weeks. Her vitamin D has improved to 21, she is advised to take vitamin D 3 2000 units qday.   5. Pre-diabetes - A1c is increasing to 6.1% from 5.8%.  I advised her to continue  MTF  ER 500mg  po qday.  -  Suggestion is made for her to avoid simple carbohydrates  from her diet including Cakes, Sweet Desserts / Pastries, Ice Cream, Soda (diet and regular), Sweet Tea, Candies, Chips, Cookies, Store Bought Juices, Alcohol in Excess of  1-2 drinks a day, Artificial Sweeteners, and "Sugar-free" Products. This will help patient to have  stable blood glucose profile and potentially avoid unintended weight gain.   Carbs and exercise regiment advised.  - I advised patient to maintain close follow up with PCP   for primary care needs. -  Suggestion is made for her to avoid simple carbohydrates  from her diet including Cakes, Sweet Desserts / Pastries, Ice Cream, Soda (diet and regular), Sweet Tea, Candies, Chips, Cookies, Store Bought Juices, Alcohol in Excess of  1-2 drinks a day, Artificial Sweeteners, and "Sugar-free" Products. This will help patient to have stable blood glucose profile and potentially avoid unintended weight gain.   Follow up plan: Return in about 6 months (around 07/01/2018) for Follow up with Pre-visit Labs.  Marquis Lunch, MD Phone: [phone]  Fax: [phone]  This note was partially dictated with voice recognition software. Similar sounding words can be transcribed inadequately or may not  be corrected upon review.  12/30/2017, 1:25 PM

## 2017-12-30 NOTE — Patient Instructions (Signed)

## 2018-03-17 ENCOUNTER — Telehealth: Payer: Self-pay

## 2018-03-17 DIAGNOSIS — E038 Other specified hypothyroidism: Secondary | ICD-10-CM

## 2018-03-17 DIAGNOSIS — I10 Essential (primary) hypertension: Secondary | ICD-10-CM

## 2018-03-17 DIAGNOSIS — R7303 Prediabetes: Secondary | ICD-10-CM

## 2018-03-17 DIAGNOSIS — E782 Mixed hyperlipidemia: Secondary | ICD-10-CM

## 2018-03-17 DIAGNOSIS — E063 Autoimmune thyroiditis: Principal | ICD-10-CM

## 2018-03-17 MED ORDER — HYDROCHLOROTHIAZIDE 25 MG PO TABS: 25.0000 mg | ORAL_TABLET | Freq: Every morning | ORAL | 1 refills | Status: DC

## 2018-03-17 MED ORDER — ROSUVASTATIN CALCIUM 5 MG PO TABS: 5.0000 mg | ORAL_TABLET | Freq: Every day | ORAL | 1 refills | Status: DC

## 2018-03-17 MED ORDER — LEVOTHYROXINE SODIUM 75 MCG PO TABS: 75.0000 ug | ORAL_TABLET | Freq: Every day | ORAL | 1 refills | Status: DC

## 2018-03-17 MED ORDER — METFORMIN HCL ER 500 MG PO TB24: ORAL_TABLET | ORAL | 1 refills | Status: DC

## 2018-03-17 NOTE — Telephone Encounter (Signed)
SIGNED

## 2018-05-29 ENCOUNTER — Other Ambulatory Visit: Payer: Self-pay

## 2018-05-29 DIAGNOSIS — E063 Autoimmune thyroiditis: Principal | ICD-10-CM

## 2018-05-29 DIAGNOSIS — E038 Other specified hypothyroidism: Secondary | ICD-10-CM

## 2018-05-29 MED ORDER — LEVOTHYROXINE SODIUM 75 MCG PO TABS: 75.0000 ug | ORAL_TABLET | Freq: Every day | ORAL | 1 refills | Status: DC

## 2018-07-02 ENCOUNTER — Ambulatory Visit: Payer: BLUE CROSS/BLUE SHIELD | Admitting: "Endocrinology

## 2018-07-30 ENCOUNTER — Ambulatory Visit: Payer: BLUE CROSS/BLUE SHIELD | Admitting: "Endocrinology

## 2018-08-18 ENCOUNTER — Telehealth: Payer: Self-pay

## 2018-08-18 DIAGNOSIS — E063 Autoimmune thyroiditis: Secondary | ICD-10-CM

## 2018-08-18 DIAGNOSIS — E559 Vitamin D deficiency, unspecified: Secondary | ICD-10-CM

## 2018-08-18 DIAGNOSIS — E782 Mixed hyperlipidemia: Secondary | ICD-10-CM

## 2018-08-18 DIAGNOSIS — E038 Other specified hypothyroidism: Secondary | ICD-10-CM

## 2018-08-18 DIAGNOSIS — R7303 Prediabetes: Secondary | ICD-10-CM

## 2018-08-18 NOTE — Telephone Encounter (Signed)
Renee Zamora, CMA  

## 2018-08-19 LAB — LIPID PANEL
Cholesterol: 151 mg/dL (ref <200)
HDL: 52 mg/dL (ref >=50)
LDL Cholesterol (Calc): 79 mg/dL (calc)
Non-HDL Cholesterol (Calc): 99 mg/dL (calc) (ref <130)
Total CHOL/HDL Ratio: 2.9 (calc) (ref <5.0)
Triglycerides: 118 mg/dL (ref <150)

## 2018-08-19 LAB — TSH: TSH: 0.37 mIU/L — ABNORMAL LOW

## 2018-08-19 LAB — HEMOGLOBIN A1C
Hgb A1c MFr Bld: 6.1 % of total Hgb — ABNORMAL HIGH (ref <5.7)
Mean Plasma Glucose: 128 (calc)
eAG (mmol/L): 7.1 (calc)

## 2018-08-19 LAB — VITAMIN D 25 HYDROXY (VIT D DEFICIENCY, FRACTURES): Vit D, 25-Hydroxy: 21 ng/mL — ABNORMAL LOW (ref 30–100)

## 2018-08-19 LAB — T4, FREE: Free T4: 1.4 ng/dL (ref 0.8–1.8)

## 2018-08-20 ENCOUNTER — Ambulatory Visit: Payer: BLUE CROSS/BLUE SHIELD | Admitting: "Endocrinology

## 2018-09-01 ENCOUNTER — Encounter: Payer: Self-pay | Admitting: "Endocrinology

## 2018-09-01 ENCOUNTER — Other Ambulatory Visit: Payer: Self-pay

## 2018-09-01 ENCOUNTER — Other Ambulatory Visit: Payer: Self-pay | Admitting: "Endocrinology

## 2018-09-01 ENCOUNTER — Ambulatory Visit (INDEPENDENT_AMBULATORY_CARE_PROVIDER_SITE_OTHER): Payer: BC Managed Care – PPO | Admitting: "Endocrinology

## 2018-09-01 VITALS — BP 144/83 | HR 72 | Ht 62.0 in | Wt 163.0 lb

## 2018-09-01 DIAGNOSIS — Z20822 Contact with and (suspected) exposure to covid-19: Secondary | ICD-10-CM

## 2018-09-01 DIAGNOSIS — I10 Essential (primary) hypertension: Secondary | ICD-10-CM

## 2018-09-01 DIAGNOSIS — E782 Mixed hyperlipidemia: Secondary | ICD-10-CM

## 2018-09-01 DIAGNOSIS — R7303 Prediabetes: Secondary | ICD-10-CM

## 2018-09-01 DIAGNOSIS — E038 Other specified hypothyroidism: Secondary | ICD-10-CM

## 2018-09-01 DIAGNOSIS — E063 Autoimmune thyroiditis: Secondary | ICD-10-CM

## 2018-09-01 DIAGNOSIS — E559 Vitamin D deficiency, unspecified: Secondary | ICD-10-CM

## 2018-09-01 NOTE — Progress Notes (Signed)
Endocrinology follow-up note   Subjective:    Patient ID: Renee Zamora, female    DOB: 01/28/1976, PCP Default, Provider, MD   Past Medical History:  Diagnosis Date  . Essential hypertension, benign   . Hypothyroidism (acquired)   . Mixed hyperlipidemia   . Prediabetes   . Vitamin D deficiency    History reviewed. No pertinent surgical history. Social History   Socioeconomic History  . Marital status: Single    Spouse name: Not on file  . Number of children: Not on file  . Years of education: Not on file  . Highest education level: Not on file  Occupational History  . Not on file  Social Needs  . Financial resource strain: Not on file  . Food insecurity    Worry: Not on file    Inability: Not on file  . Transportation needs    Medical: Not on file    Non-medical: Not on file  Tobacco Use  . Smoking status: Never Smoker  . Smokeless tobacco: Never Used  Substance and Sexual Activity  . Alcohol use: No  . Drug use: No  . Sexual activity: Yes    Birth control/protection: Pill  Lifestyle  . Physical activity    Days per week: Not on file    Minutes per session: Not on file  . Stress: Not on file  Relationships  . Social Musicianconnections    Talks on phone: Not on file    Gets together: Not on file    Attends religious service: Not on file    Active member of club or organization: Not on file    Attends meetings of clubs or organizations: Not on file    Relationship status: Not on file  Other Topics Concern  . Not on file  Social History Narrative  . Not on file   Outpatient Encounter Medications as of 09/01/2018  Medication Sig  . hydrochlorothiazide (HYDRODIURIL) 25 MG tablet Take 1 tablet (25 mg total) by mouth every morning.  Marland Kitchen. levothyroxine (SYNTHROID, LEVOTHROID) 75 MCG tablet Take 1 tablet (75 mcg total) by mouth daily before breakfast.  . rosuvastatin (CRESTOR) 5 MG tablet Take 1 tablet (5 mg total) by mouth daily.  . [DISCONTINUED] metFORMIN  (GLUCOPHAGE-XR) 500 MG 24 hr tablet TAKE 1 TABLET BY MOUTH ONCE DAILY WITH BREAKFAST   No facility-administered encounter medications on file as of 09/01/2018.    ALLERGIES: No Known Allergies VACCINATION STATUS:  There is no immunization history on file for this patient.  HPI Mrs. Renee Zamora is a 43 - yr-old female with  diagnosis of Hashimoto thyroiditis with hypothyroidism, hypertension, hyperlipidemia,  prediabetes, and vitamin D deficiency. She remains on levothyroxine 75 mcg p.o. nightly, Crestor 5 mg p.o. daily, hydrochlorothiazide 25 mg p.o. daily.  She is concerned about recent recall of metformin, and stopped taking it.  Her previsit labs show A1c of 6.1%.   she has steady weight. She has no new complaints today. Pt denies family history of thyroid dysfunction. she denies personal history of goiter.  Review of Systems Constitutional:  no fatigue, no subjective hyperthermia/hypothermia Eyes: no blurry vision, no xerophthalmia ENT: no sore throat, no nodules palpated in throat, no dysphagia/odynophagia, no hoarseness Cardiovascular: no CP/SOB/palpitations/leg swelling  Musculoskeletal: no muscle/joint aches Skin: no rashes Neurological: no tremors/numbness/tingling/dizziness Psychiatric: no depression/anxiety  Objective:    BP (!) 144/83   Pulse 72   Ht 5\' 2"  (1.575 m)   Wt 163 lb (73.9 kg)   BMI 29.81 kg/m  Wt Readings from Last 3 Encounters:  09/01/18 163 lb (73.9 kg)  12/30/17 159 lb (72.1 kg)  05/16/17 159 lb (72.1 kg)    Physical Exam Constitutional:  not in acute distress, normal state of mind Eyes:  EOMI, no exophthalmos Neck: Supple Respiratory: Adequate breathing efforts Musculoskeletal: no gross deformities, strength intact in all four extremities Skin:  no rashes, no hyperemia Neurological: no tremor with outstretched hands.   Recent Results (from the past 2160 hour(s))  Hemoglobin A1c     Status: Abnormal   Collection Time: 08/18/18 11:45 AM  Result  Value Ref Range   Hgb A1c MFr Bld 6.1 (H) <5.7 % of total Hgb    Comment: For someone without known diabetes, a hemoglobin  A1c value between 5.7% and 6.4% is consistent with prediabetes and should be confirmed with a  follow-up test. . For someone with known diabetes, a value <7% indicates that their diabetes is well controlled. A1c targets should be individualized based on duration of diabetes, age, comorbid conditions, and other considerations. . This assay result is consistent with an increased risk of diabetes. . Currently, no consensus exists regarding use of hemoglobin A1c for diagnosis of diabetes for children. .    Mean Plasma Glucose 128 (calc)   eAG (mmol/L) 7.1 (calc)  TSH     Status: Abnormal   Collection Time: 08/18/18 11:45 AM  Result Value Ref Range   TSH 0.37 (L) mIU/L    Comment:           Reference Range .           > or = 20 Years  0.40-4.50 .                Pregnancy Ranges           First trimester    0.26-2.66           Second trimester   0.55-2.73           Third trimester    0.43-2.91   T4, Free     Status: None   Collection Time: 08/18/18 11:45 AM  Result Value Ref Range   Free T4 1.4 0.8 - 1.8 ng/dL  VITAMIN D 25 Hydroxy (Vit-D Deficiency, Fractures)     Status: Abnormal   Collection Time: 08/18/18 11:45 AM  Result Value Ref Range   Vit D, 25-Hydroxy 21 (L) 30 - 100 ng/mL    Comment: Vitamin D Status         25-OH Vitamin D: . Deficiency:                    <20 ng/mL Insufficiency:             20 - 29 ng/mL Optimal:                 > or = 30 ng/mL . For 25-OH Vitamin D testing on patients on  D2-supplementation and patients for whom quantitation  of D2 and D3 fractions is required, the [facility](TM) 25-OH VIT D, (D2,D3), LC/MS/MS is recommended: order  code [id number] (patients >59yrs). See Note 1 . Note 1 . For additional information, please refer to  http://education.[facility].com/faq/FAQ199  (This link is being provided for  informational/ educational purposes only.)   Lipid Panel     Status: None   Collection Time: 08/18/18 11:45 AM  Result Value Ref Range   Cholesterol 151 <200 mg/dL   HDL 52 > OR = 50 mg/dL  Triglycerides 118 <150 mg/dL   LDL Cholesterol (Calc) 79 mg/dL (calc)    Comment: Reference range: <100 . Desirable range <100 mg/dL for primary prevention;   <70 mg/dL for patients with CHD or diabetic patients  with > or = 2 CHD risk factors. Marland Kitchen. LDL-C is now calculated using the Martin-Hopkins  calculation, which is a validated novel method providing  better accuracy than the Friedewald equation in the  estimation of LDL-C.  Horald PollenMartin SS et al. Lenox AhrJAMA. 8119;147(822013;310(19): 2061-2068  (http://education.[facility].com/faq/FAQ164)    Total CHOL/HDL Ratio 2.9 <5.0 (calc)   Non-HDL Cholesterol (Calc) 99 <956<130 mg/dL (calc)    Comment: For patients with diabetes plus 1 major ASCVD risk  factor, treating to a non-HDL-C goal of <100 mg/dL  (LDL-C of <21<70 mg/dL) is considered a therapeutic  option.       Assessment & Plan:   1. Hypothyroidism Due to Hashimoto's thyroiditis Her thyroid function tests are consistent with appropriate replacement.  She is advised to continue Synthroid 75 mcg p.o. every morning.    - We discussed about the correct intake of her thyroid hormone, on empty stomach at fasting, with water, separated by at least 30 minutes from breakfast and other medications,  and separated by more than 4 hours from calcium, iron, multivitamins, acid reflux medications (PPIs). -Patient is made aware of the fact that thyroid hormone replacement is needed for life, dose to be adjusted by periodic monitoring of thyroid function tests.    2. Essential hypertension, benign Her blood pressure is not controlled to target.  She reports some inconsistency taking her hydrochlorothiazide.  She is advised to be consistent on her hydrochlorothiazide 25 mg p.o. daily at breakfast.      3.  Hyperlipidemia - Her LDL is 79 improving from 135.  She is benefiting from Crestor treatment.  Advised to continue Crestor 5 mg p.o. nightly.   . SE and precautions discussed with her.   4. Pre-diabetes - A1c is stable at 6.1%, unchanged from her last visit.  She is concerned about her metformin being recalled.  She is advised to stay off of it for now.   - she  admits there is a room for improvement in her diet and drink choices. -  Suggestion is made for her to avoid simple carbohydrates  from her diet including Cakes, Sweet Desserts / Pastries, Ice Cream, Soda (diet and regular), Sweet Tea, Candies, Chips, Cookies, Sweet Pastries,  Store Bought Juices, Alcohol in Excess of  1-2 drinks a day, Artificial Sweeteners, Coffee Creamer, and "Sugar-free" Products. This will help patient to have stable blood glucose profile and potentially avoid unintended weight gain.  - Time spent with the patient: 20 min, of which >50% was spent in reviewing her  current and  previous labs/studies, previous treatments, and medications doses and developing a plan for long-term care based on the latest recommendations for standards of care. Please refer to " Patient Self Inventory" in the Media  tab for reviewed elements of pertinent patient history. Renee MomBernice Milbourne participated in the discussions, expressed understanding, and voiced agreement with the above plans.  All questions were answered to her satisfaction. she is encouraged to contact clinic should she have any questions or concerns prior to her return visit.   Follow up plan: Return in about 6 months (around 03/03/2019) for Follow up with Pre-visit Labs.  Marquis LunchGebre Elan Brainerd, MD Phone: [phone]  Fax: [phone]  This note was partially dictated with voice recognition software. Similar sounding words can be  transcribed inadequately or may not  be corrected upon review.  09/01/2018, 1:15 PM

## 2018-09-01 NOTE — Patient Instructions (Signed)

## 2018-09-06 LAB — NOVEL CORONAVIRUS, NAA: SARS-CoV-2, NAA: NOT DETECTED

## 2019-02-23 ENCOUNTER — Other Ambulatory Visit: Payer: Self-pay | Admitting: "Endocrinology

## 2019-02-23 DIAGNOSIS — E038 Other specified hypothyroidism: Secondary | ICD-10-CM

## 2019-02-23 DIAGNOSIS — E063 Autoimmune thyroiditis: Secondary | ICD-10-CM

## 2019-03-05 ENCOUNTER — Ambulatory Visit: Payer: BC Managed Care – PPO | Admitting: "Endocrinology

## 2019-03-13 ENCOUNTER — Telehealth: Payer: Self-pay

## 2019-03-13 DIAGNOSIS — R7303 Prediabetes: Secondary | ICD-10-CM

## 2019-03-13 DIAGNOSIS — E063 Autoimmune thyroiditis: Secondary | ICD-10-CM

## 2019-03-13 DIAGNOSIS — E559 Vitamin D deficiency, unspecified: Secondary | ICD-10-CM

## 2019-03-13 NOTE — Telephone Encounter (Signed)
LeighAnn Ismael Karge, CMA  

## 2019-03-14 LAB — HEMOGLOBIN A1C
Hgb A1c MFr Bld: 6.3 % of total Hgb — ABNORMAL HIGH (ref <5.7)
Mean Plasma Glucose: 134 (calc)
eAG (mmol/L): 7.4 (calc)

## 2019-03-14 LAB — VITAMIN D 25 HYDROXY (VIT D DEFICIENCY, FRACTURES): Vit D, 25-Hydroxy: 15 ng/mL — ABNORMAL LOW (ref 30–100)

## 2019-03-14 LAB — TSH+FREE T4: TSH W/REFLEX TO FT4: 0.83 mIU/L

## 2019-03-19 ENCOUNTER — Encounter: Payer: Self-pay | Admitting: "Endocrinology

## 2019-03-19 ENCOUNTER — Ambulatory Visit (INDEPENDENT_AMBULATORY_CARE_PROVIDER_SITE_OTHER): Payer: Self-pay | Admitting: "Endocrinology

## 2019-03-19 DIAGNOSIS — E038 Other specified hypothyroidism: Secondary | ICD-10-CM

## 2019-03-19 DIAGNOSIS — I10 Essential (primary) hypertension: Secondary | ICD-10-CM

## 2019-03-19 DIAGNOSIS — R7303 Prediabetes: Secondary | ICD-10-CM

## 2019-03-19 DIAGNOSIS — E063 Autoimmune thyroiditis: Secondary | ICD-10-CM

## 2019-03-19 DIAGNOSIS — E559 Vitamin D deficiency, unspecified: Secondary | ICD-10-CM

## 2019-03-19 DIAGNOSIS — E782 Mixed hyperlipidemia: Secondary | ICD-10-CM

## 2019-03-19 MED ORDER — HYDROCHLOROTHIAZIDE 25 MG PO TABS: 25.0000 mg | ORAL_TABLET | Freq: Every day | ORAL | 1 refills | Status: DC

## 2019-03-19 MED ORDER — ROSUVASTATIN CALCIUM 5 MG PO TABS: 5.0000 mg | ORAL_TABLET | Freq: Every day | ORAL | 1 refills | Status: DC

## 2019-03-19 MED ORDER — METFORMIN HCL ER 500 MG PO TB24: 500.0000 mg | ORAL_TABLET | Freq: Every day | ORAL | 1 refills | Status: DC

## 2019-03-19 MED ORDER — LEVOTHYROXINE SODIUM 75 MCG PO TABS: ORAL_TABLET | ORAL | 1 refills | Status: DC

## 2019-03-19 MED ORDER — VITAMIN D3 125 MCG (5000 UT) PO CAPS: 5000.0000 [IU] | ORAL_CAPSULE | Freq: Every day | ORAL | 0 refills | Status: DC

## 2019-03-19 NOTE — Progress Notes (Signed)
03/19/2019                                Endocrinology Telehealth Visit Follow up Note -During COVID -19 Pandemic  I connected with Renee Zamora on 03/19/2019   by telephone and verified that I am speaking with the correct person using two identifiers. Renee Zamora, 01-May-1975. she has verbally consented to this visit. All issues noted in this document were discussed and addressed. The format was not optimal for physical exam.  Subjective:    Patient ID: Renee Zamora, female    DOB: 02-13-1976, PCP Default, Provider, MD   Past Medical History:  Diagnosis Date  . Essential hypertension, benign   . Hypothyroidism (acquired)   . Mixed hyperlipidemia   . Prediabetes   . Vitamin D deficiency    History reviewed. No pertinent surgical history. Social History   Socioeconomic History  . Marital status: Single    Spouse name: Not on file  . Number of children: Not on file  . Years of education: Not on file  . Highest education level: Not on file  Occupational History  . Not on file  Tobacco Use  . Smoking status: Never Smoker  . Smokeless tobacco: Never Used  Substance and Sexual Activity  . Alcohol use: No  . Drug use: No  . Sexual activity: Yes    Birth control/protection: Pill  Other Topics Concern  . Not on file  Social History Narrative  . Not on file   Social Determinants of Health   Financial Resource Strain:   . Difficulty of Paying Living Expenses: Not on file  Food Insecurity:   . Worried About Programme researcher, broadcasting/film/video in the Last Year: Not on file  . Ran Out of Food in the Last Year: Not on file  Transportation Needs:   . Lack of Transportation (Medical): Not on file  . Lack of Transportation (Non-Medical): Not on file  Physical Activity:   . Days of Exercise per Week: Not on file  . Minutes of Exercise per Session: Not on file  Stress:   . Feeling of Stress : Not on file  Social Connections:   . Frequency of Communication with Friends and Family: Not on file  .  Frequency of Social Gatherings with Friends and Family: Not on file  . Attends Religious Services: Not on file  . Active Member of Clubs or Organizations: Not on file  . Attends Banker Meetings: Not on file  . Marital Status: Not on file   Outpatient Encounter Medications as of 03/19/2019  Medication Sig  . Cholecalciferol (VITAMIN D3) 125 MCG (5000 UT) CAPS Take 1 capsule (5,000 Units total) by mouth daily.  . hydrochlorothiazide (HYDRODIURIL) 25 MG tablet Take 1 tablet (25 mg total) by mouth daily.  Marland Kitchen levothyroxine (SYNTHROID) 75 MCG tablet TAKE 1 TABLET (75 MCG TOTAL) BY MOUTH DAILY BEFORE BREAKFAST.  . metFORMIN (GLUCOPHAGE-XR) 500 MG 24 hr tablet Take 1 tablet (500 mg total) by mouth daily with breakfast.  . rosuvastatin (CRESTOR) 5 MG tablet Take 1 tablet (5 mg total) by mouth daily.  . [DISCONTINUED] hydrochlorothiazide (HYDRODIURIL) 25 MG tablet TAKE 1 TABLET BY MOUTH EVERY DAY IN THE MORNING  . [DISCONTINUED] metFORMIN (GLUCOPHAGE-XR) 500 MG 24 hr tablet TAKE 1 TABLET BY MOUTH ONCE DAILY WITH BREAKFAST  . [DISCONTINUED] rosuvastatin (CRESTOR) 5 MG tablet TAKE 1 TABLET BY MOUTH EVERY DAY  . [DISCONTINUED] SYNTHROID  75 MCG tablet TAKE 1 TABLET (75 MCG TOTAL) BY MOUTH DAILY BEFORE BREAKFAST.   No facility-administered encounter medications on file as of 03/19/2019.   ALLERGIES: No Known Allergies VACCINATION STATUS:  There is no immunization history on file for this patient.  HPI Mrs. Bitton is a 44 year old female patient with medical history of hypothyroidism due to Hashimoto's thyroiditis, hypertension, hyperlipidemia, prediabetes, and vitamin D deficiency.   She is being engaged in telehealth via telephone for follow-up of these conditions. She remains on levothyroxine 75 mcg p.o. nightly, Crestor 5 mg p.o. daily, hydrochlorothiazide 25 mg p.o. daily.  Due to her concern about recent recall of Metformin, she decided to stay off of it.  Her previsit labs show A1c  increasing to 6.3%.    she has steady weight. She has no new complaints today. - she denies family history of thyroid dysfunction. she denies personal history of goiter.  Review of Systems   Limited as above  Objective:    There were no vitals taken for this visit.  Wt Readings from Last 3 Encounters:  09/01/18 163 lb (73.9 kg)  12/30/17 159 lb (72.1 kg)  05/16/17 159 lb (72.1 kg)    Physical Exam    Recent Results (from the past 2160 hour(s))  HgB A1c     Status: Abnormal   Collection Time: 03/13/19  9:45 AM  Result Value Ref Range   Hgb A1c MFr Bld 6.3 (H) <5.7 % of total Hgb    Comment: For someone without known diabetes, a hemoglobin  A1c value between 5.7% and 6.4% is consistent with prediabetes and should be confirmed with a  follow-up test. . For someone with known diabetes, a value <7% indicates that their diabetes is well controlled. A1c targets should be individualized based on duration of diabetes, age, comorbid conditions, and other considerations. . This assay result is consistent with an increased risk of diabetes. . Currently, no consensus exists regarding use of hemoglobin A1c for diagnosis of diabetes for children. .    Mean Plasma Glucose 134 (calc)   eAG (mmol/L) 7.4 (calc)  VITAMIN D 25 Hydroxy (Vit-D Deficiency, Fractures)     Status: Abnormal   Collection Time: 03/13/19  9:45 AM  Result Value Ref Range   Vit D, 25-Hydroxy 15 (L) 30 - 100 ng/mL    Comment: Vitamin D Status         25-OH Vitamin D: . Deficiency:                    <20 ng/mL Insufficiency:             20 - 29 ng/mL Optimal:                 > or = 30 ng/mL . For 25-OH Vitamin D testing on patients on  D2-supplementation and patients for whom quantitation  of D2 and D3 fractions is required, the [facility](TM) 25-OH VIT D, (D2,D3), LC/MS/MS is recommended: order  code [id number] (patients >23yrs). See Note 1 . Note 1 . For additional information, please refer to   http://education.[facility].com/faq/FAQ199  (This link is being provided for informational/ educational purposes only.)   TSH + free T4     Status: None   Collection Time: 03/13/19  9:45 AM  Result Value Ref Range   TSH W/REFLEX TO FT4 0.83 mIU/L    Comment:           Reference Range .           >  or = 20 Years  0.40-4.50 .                Pregnancy Ranges           First trimester    0.26-2.66           Second trimester   0.55-2.73           Third trimester    0.43-2.91       Assessment & Plan:   1. Hypothyroidism Due to Hashimoto's thyroiditis Her thyroid function tests are consistent with appropriate replacement.  She is advised to continue Synthroid 75 mcg p.o. daily before breakfast.    - We discussed about the correct intake of her thyroid hormone, on empty stomach at fasting, with water, separated by at least 30 minutes from breakfast and other medications,  and separated by more than 4 hours from calcium, iron, multivitamins, acid reflux medications (PPIs). -Patient is made aware of the fact that thyroid hormone replacement is needed for life, dose to be adjusted by periodic monitoring of thyroid function tests.    2. Essential hypertension she is advised to home monitor blood pressure and report if > 140/90 on 2 separate readings.  She reports better consistency in her medications this time.  She is advised to continue hydrochlorothiazide 25 mg p.o. daily with breakfast.    3. Hyperlipidemia - Her LDL is 79 improving from 135.  She is benefiting from Crestor treatment.  She is advised to continue Crestor 5 mg p.o. daily at bedtime.  Side effects and precautions discussed with her.    4. Pre-diabetes -Her previsit A1c is 6.3%, increasing.  This is a result of withdrawal of Metformin.   She is approached to resume Metformin 500 mg XR p.o. daily after breakfast and she agrees.    - she  admits there is a room for improvement in her diet and drink  choices. -  Suggestion is made for her to avoid simple carbohydrates  from her diet including Cakes, Sweet Desserts / Pastries, Ice Cream, Soda (diet and regular), Sweet Tea, Candies, Chips, Cookies, Sweet Pastries,  Store Bought Juices, Alcohol in Excess of  1-2 drinks a day, Artificial Sweeteners, Coffee Creamer, and "Sugar-free" Products. This will help patient to have stable blood glucose profile and potentially avoid unintended weight gain.    - Time spent on this patient care encounter:  25 min, of which >50% was spent in  counseling and the rest reviewing her  current and  previous labs/studies ( including abstraction from other facilities),  previous treatments,  and medications' doses and developing a plan for long-term care based on the latest recommendations for standards of care; and documenting her care.  Renee Zamora participated in the discussions, expressed understanding, and voiced agreement with the above plans.  All questions were answered to her satisfaction. she is encouraged to contact clinic should she have any questions or concerns prior to her return visit.   Follow up plan: Return in about 6 months (around 09/16/2019) for Follow up with Pre-visit Labs, Next Visit A1c in Office.  Marquis Lunch, MD Phone: [phone]  Fax: [phone]  This note was partially dictated with voice recognition software. Similar sounding words can be transcribed inadequately or may not  be corrected upon review.  03/19/2019, 3:49 PM

## 2019-09-17 ENCOUNTER — Ambulatory Visit: Payer: Self-pay | Admitting: "Endocrinology

## 2019-09-19 ENCOUNTER — Other Ambulatory Visit: Payer: Self-pay | Admitting: "Endocrinology

## 2019-09-19 DIAGNOSIS — I10 Essential (primary) hypertension: Secondary | ICD-10-CM

## 2019-12-09 ENCOUNTER — Other Ambulatory Visit: Payer: Self-pay

## 2019-12-09 DIAGNOSIS — E038 Other specified hypothyroidism: Secondary | ICD-10-CM

## 2019-12-09 MED ORDER — LEVOTHYROXINE SODIUM 75 MCG PO TABS: ORAL_TABLET | ORAL | 1 refills | Status: DC

## 2019-12-12 ENCOUNTER — Other Ambulatory Visit: Payer: Self-pay | Admitting: "Endocrinology

## 2019-12-12 DIAGNOSIS — I10 Essential (primary) hypertension: Secondary | ICD-10-CM

## 2019-12-14 ENCOUNTER — Ambulatory Visit: Payer: Self-pay | Admitting: "Endocrinology

## 2019-12-17 ENCOUNTER — Ambulatory Visit: Payer: Self-pay | Admitting: Nurse Practitioner

## 2020-03-10 ENCOUNTER — Other Ambulatory Visit: Payer: Self-pay | Admitting: "Endocrinology

## 2020-03-10 DIAGNOSIS — I10 Essential (primary) hypertension: Secondary | ICD-10-CM

## 2020-07-06 ENCOUNTER — Other Ambulatory Visit: Payer: Self-pay | Admitting: "Endocrinology

## 2020-07-06 DIAGNOSIS — I10 Essential (primary) hypertension: Secondary | ICD-10-CM

## 2020-07-15 ENCOUNTER — Other Ambulatory Visit: Payer: Self-pay | Admitting: "Endocrinology

## 2020-07-15 DIAGNOSIS — E063 Autoimmune thyroiditis: Secondary | ICD-10-CM

## 2020-07-15 DIAGNOSIS — I10 Essential (primary) hypertension: Secondary | ICD-10-CM

## 2020-07-15 DIAGNOSIS — E038 Other specified hypothyroidism: Secondary | ICD-10-CM

## 2020-07-25 ENCOUNTER — Telehealth: Payer: Self-pay

## 2020-07-25 DIAGNOSIS — E782 Mixed hyperlipidemia: Secondary | ICD-10-CM

## 2020-07-25 DIAGNOSIS — E038 Other specified hypothyroidism: Secondary | ICD-10-CM

## 2020-07-25 DIAGNOSIS — E559 Vitamin D deficiency, unspecified: Secondary | ICD-10-CM

## 2020-07-25 DIAGNOSIS — I10 Essential (primary) hypertension: Secondary | ICD-10-CM

## 2020-07-25 NOTE — Telephone Encounter (Signed)
Pt asked for refills and I advised pt she has not been here since January 2021. Patient made aware she needs labs, pt did make an appt for the end of June to her schedule.

## 2020-07-25 NOTE — Telephone Encounter (Signed)
Can you update lab orders? 

## 2020-07-25 NOTE — Telephone Encounter (Signed)
Done

## 2020-08-18 LAB — COMPREHENSIVE METABOLIC PANEL
ALT: 27 IU/L (ref 0–32)
AST: 22 IU/L (ref 0–40)
Albumin/Globulin Ratio: 1.5 (ref 1.2–2.2)
Albumin: 4.5 g/dL (ref 3.8–4.8)
Alkaline Phosphatase: 80 IU/L (ref 44–121)
BUN/Creatinine Ratio: 13 (ref 9–23)
BUN: 9 mg/dL (ref 6–24)
Bilirubin Total: 0.5 mg/dL (ref 0.0–1.2)
CO2: 21 mmol/L (ref 20–29)
Calcium: 9.3 mg/dL (ref 8.7–10.2)
Chloride: 101 mmol/L (ref 96–106)
Creatinine, Ser: 0.71 mg/dL (ref 0.57–1.00)
Globulin, Total: 3.1 g/dL (ref 1.5–4.5)
Glucose: 90 mg/dL (ref 65–99)
Potassium: 3.9 mmol/L (ref 3.5–5.2)
Sodium: 140 mmol/L (ref 134–144)
Total Protein: 7.6 g/dL (ref 6.0–8.5)
eGFR: 107 mL/min/{1.73_m2} (ref >59)

## 2020-08-18 LAB — LIPID PANEL
Chol/HDL Ratio: 4.3 ratio (ref 0.0–4.4)
Cholesterol, Total: 239 mg/dL — ABNORMAL HIGH (ref 100–199)
HDL: 55 mg/dL (ref >39)
LDL Chol Calc (NIH): 155 mg/dL — ABNORMAL HIGH (ref 0–99)
Triglycerides: 163 mg/dL — ABNORMAL HIGH (ref 0–149)
VLDL Cholesterol Cal: 29 mg/dL (ref 5–40)

## 2020-08-18 LAB — TSH: TSH: 2.92 u[IU]/mL (ref 0.450–4.500)

## 2020-08-18 LAB — VITAMIN D 25 HYDROXY (VIT D DEFICIENCY, FRACTURES): Vit D, 25-Hydroxy: 40.9 ng/mL (ref 30.0–100.0)

## 2020-08-18 LAB — T4, FREE: Free T4: 1.47 ng/dL (ref 0.82–1.77)

## 2020-08-24 ENCOUNTER — Encounter: Payer: Self-pay | Admitting: Nurse Practitioner

## 2020-08-24 ENCOUNTER — Other Ambulatory Visit: Payer: Self-pay

## 2020-08-24 ENCOUNTER — Ambulatory Visit (INDEPENDENT_AMBULATORY_CARE_PROVIDER_SITE_OTHER): Payer: BC Managed Care – PPO | Admitting: Nurse Practitioner

## 2020-08-24 VITALS — BP 147/83 | HR 73 | Ht 62.0 in | Wt 168.0 lb

## 2020-08-24 DIAGNOSIS — E782 Mixed hyperlipidemia: Secondary | ICD-10-CM

## 2020-08-24 DIAGNOSIS — E063 Autoimmune thyroiditis: Secondary | ICD-10-CM

## 2020-08-24 DIAGNOSIS — E559 Vitamin D deficiency, unspecified: Secondary | ICD-10-CM | POA: Diagnosis not present

## 2020-08-24 DIAGNOSIS — R7303 Prediabetes: Secondary | ICD-10-CM | POA: Diagnosis not present

## 2020-08-24 DIAGNOSIS — E038 Other specified hypothyroidism: Secondary | ICD-10-CM | POA: Diagnosis not present

## 2020-08-24 DIAGNOSIS — I10 Essential (primary) hypertension: Secondary | ICD-10-CM

## 2020-08-24 LAB — POCT GLYCOSYLATED HEMOGLOBIN (HGB A1C): Hemoglobin A1C: 6.3 % — AB (ref 4.0–5.6)

## 2020-08-24 MED ORDER — ROSUVASTATIN CALCIUM 5 MG PO TABS: 5.0000 mg | ORAL_TABLET | Freq: Every day | ORAL | 1 refills | Status: DC

## 2020-08-24 MED ORDER — LEVOTHYROXINE SODIUM 75 MCG PO TABS: ORAL_TABLET | ORAL | 1 refills | Status: DC

## 2020-08-24 NOTE — Patient Instructions (Signed)

## 2020-08-24 NOTE — Progress Notes (Signed)
08/24/2020                    Endocrinology Follow Up Visit  Subjective:    Patient ID: Renee Zamora, female    DOB: 1976-01-23, PCP Default, Provider, MD   Past Medical History:  Diagnosis Date   Essential hypertension, benign    Hypothyroidism (acquired)    Mixed hyperlipidemia    Prediabetes    Vitamin D deficiency    History reviewed. No pertinent surgical history. Social History   Socioeconomic History   Marital status: Single    Spouse name: Not on file   Number of children: Not on file   Years of education: Not on file   Highest education level: Not on file  Occupational History   Not on file  Tobacco Use   Smoking status: Never   Smokeless tobacco: Never  Vaping Use   Vaping Use: Never used  Substance and Sexual Activity   Alcohol use: No   Drug use: No   Sexual activity: Yes    Birth control/protection: Pill  Other Topics Concern   Not on file  Social History Narrative   Not on file   Social Determinants of Health   Financial Resource Strain: Not on file  Food Insecurity: Not on file  Transportation Needs: Not on file  Physical Activity: Not on file  Stress: Not on file  Social Connections: Not on file   Outpatient Encounter Medications as of 08/24/2020  Medication Sig   levothyroxine (SYNTHROID) 75 MCG tablet TAKE 1 TABLET (75 MCG TOTAL) BY MOUTH DAILY BEFORE BREAKFAST.   rosuvastatin (CRESTOR) 5 MG tablet Take 1 tablet (5 mg total) by mouth daily.   [DISCONTINUED] Cholecalciferol (VITAMIN D3) 125 MCG (5000 UT) CAPS Take 1 capsule (5,000 Units total) by mouth daily.   [DISCONTINUED] hydrochlorothiazide (HYDRODIURIL) 25 MG tablet TAKE 1 TABLET BY MOUTH EVERY DAY   [DISCONTINUED] levothyroxine (SYNTHROID) 75 MCG tablet TAKE 1 TABLET (75 MCG TOTAL) BY MOUTH DAILY BEFORE BREAKFAST.   [DISCONTINUED] metFORMIN (GLUCOPHAGE-XR) 500 MG 24 hr tablet Take 1 tablet (500 mg total) by mouth daily with breakfast.   [DISCONTINUED] rosuvastatin (CRESTOR) 5 MG  tablet Take 1 tablet (5 mg total) by mouth daily.   No facility-administered encounter medications on file as of 08/24/2020.   ALLERGIES: No Known Allergies VACCINATION STATUS:  There is no immunization history on file for this patient.  Thyroid Problem Presents for follow-up visit. Symptoms include weight gain. Patient reports no cold intolerance, constipation, hair loss or palpitations. (Has not been taking her thyroid medication in over 3 months) The symptoms have been stable. Her past medical history is significant for hyperlipidemia.  Hyperlipidemia This is a chronic problem. The current episode started more than 1 year ago. The problem is uncontrolled. Recent lipid tests were reviewed and are high. Exacerbating diseases include hypothyroidism. Factors aggravating her hyperlipidemia include fatty foods. She is currently on no antihyperlipidemic treatment (stopped taking her Statin in between visits). Compliance problems include adherence to exercise, adherence to diet and psychosocial issues.  Risk factors for coronary artery disease include hypertension, dyslipidemia and a sedentary lifestyle.  Hypertension This is a chronic problem. The current episode started more than 1 year ago. The problem has been gradually improving since onset. The problem is controlled. Pertinent negatives include no palpitations. There are no associated agents to hypertension. Risk factors for coronary artery disease include dyslipidemia, obesity and sedentary lifestyle. Past treatments include nothing (has not been taking her BP meds  as prescribed). Compliance problems include diet, exercise and psychosocial issues.  Identifiable causes of hypertension include a thyroid problem.   Renee Zamora is a 45 year old female patient with medical history of hypothyroidism due to Hashimoto's thyroiditis, hypertension, hyperlipidemia, prediabetes, and vitamin D deficiency.    - she denies family history of thyroid dysfunction.  she denies personal history of goiter.  Review of systems  Constitutional: + steadily increasing body weight,  current Body mass index is 30.73 kg/m. , + fatigue, no subjective hyperthermia, no subjective hypothermia Eyes: no blurry vision, no xerophthalmia ENT: no sore throat, no nodules palpated in throat, no dysphagia/odynophagia, no hoarseness Cardiovascular: no chest pain, no shortness of breath, no palpitations, no leg swelling Respiratory: no cough, no shortness of breath Gastrointestinal: no nausea/vomiting/diarrhea Musculoskeletal: no muscle/joint aches Skin: no rashes, no hyperemia Neurological: no tremors, no numbness, no tingling, no dizziness, + frequent headaches Psychiatric: no depression, no anxiety  Objective:    BP (!) 147/83   Pulse 73   Ht 5' 2" (1.575 m)   Wt 168 lb (76.2 kg)   BMI 30.73 kg/m   Wt Readings from Last 3 Encounters:  08/24/20 168 lb (76.2 kg)  09/01/18 163 lb (73.9 kg)  12/30/17 159 lb (72.1 kg)    BP Readings from Last 3 Encounters:  08/24/20 (!) 147/83  09/01/18 (!) 144/83  12/30/17 115/76     Physical Exam- Limited  Constitutional:  Body mass index is 30.73 kg/m. , not in acute distress, normal state of mind Eyes:  EOMI, no exophthalmos Neck: Supple Cardiovascular: RRR, no murmurs, rubs, or gallops, no edema Respiratory: Adequate breathing efforts, no crackles, rales, rhonchi, or wheezing Musculoskeletal: no gross deformities, strength intact in all four extremities, no gross restriction of joint movements Skin:  no rashes, no hyperemia Neurological: no tremor with outstretched hands   Recent Results (from the past 2160 hour(s))  Lipid Panel     Status: Abnormal   Collection Time: 08/17/20  3:22 PM  Result Value Ref Range   Cholesterol, Total 239 (H) 100 - 199 mg/dL   Triglycerides 163 (H) 0 - 149 mg/dL   HDL 55 >39 mg/dL   VLDL Cholesterol Cal 29 5 - 40 mg/dL   LDL Chol Calc (NIH) 155 (H) 0 - 99 mg/dL   Chol/HDL Ratio  4.3 0.0 - 4.4 ratio    Comment:                                   T. Chol/HDL Ratio                                             Men  Women                               1/2 Avg.Risk  3.4    3.3                                   Avg.Risk  5.0    4.4  2X Avg.Risk  9.6    7.1                                3X Avg.Risk 23.4   11.0   Comprehensive metabolic panel     Status: None   Collection Time: 08/17/20  3:22 PM  Result Value Ref Range   Glucose 90 65 - 99 mg/dL   BUN 9 6 - 24 mg/dL   Creatinine, Ser 0.71 0.57 - 1.00 mg/dL   eGFR 107 >59 mL/min/1.73   BUN/Creatinine Ratio 13 9 - 23   Sodium 140 134 - 144 mmol/L   Potassium 3.9 3.5 - 5.2 mmol/L   Chloride 101 96 - 106 mmol/L   CO2 21 20 - 29 mmol/L   Calcium 9.3 8.7 - 10.2 mg/dL   Total Protein 7.6 6.0 - 8.5 g/dL   Albumin 4.5 3.8 - 4.8 g/dL   Globulin, Total 3.1 1.5 - 4.5 g/dL   Albumin/Globulin Ratio 1.5 1.2 - 2.2   Bilirubin Total 0.5 0.0 - 1.2 mg/dL   Alkaline Phosphatase 80 44 - 121 IU/L   AST 22 0 - 40 IU/L   ALT 27 0 - 32 IU/L  VITAMIN D 25 Hydroxy (Vit-D Deficiency, Fractures)     Status: None   Collection Time: 08/17/20  3:22 PM  Result Value Ref Range   Vit D, 25-Hydroxy 40.9 30.0 - 100.0 ng/mL    Comment: Vitamin D deficiency has been defined by the Institute of Medicine and an Endocrine Society practice guideline as a level of serum 25-OH vitamin D less than 20 ng/mL (1,2). The Endocrine Society went on to further define vitamin D insufficiency as a level between 21 and 29 ng/mL (2). 1. IOM (Institute of Medicine). 2010. Dietary reference    intakes for calcium and D. [place]: The    [employer]. 2. Holick MF, Binkley Lehr, Bischoff-Ferrari HA, et al.    Evaluation, treatment, and prevention of vitamin D    deficiency: an Endocrine Society clinical practice    guideline. JCEM. 2011 Jul; 96(7):1911-30.   T4, Free     Status: None   Collection Time: 08/17/20  3:22  PM  Result Value Ref Range   Free T4 1.47 0.82 - 1.77 ng/dL  TSH     Status: None   Collection Time: 08/17/20  3:22 PM  Result Value Ref Range   TSH 2.920 0.450 - 4.500 uIU/mL  HgB A1c     Status: Abnormal   Collection Time: 08/24/20  4:23 PM  Result Value Ref Range   Hemoglobin A1C 6.3 (A) 4.0 - 5.6 %   HbA1c POC (<> result, manual entry)     HbA1c, POC (prediabetic range)     HbA1c, POC (controlled diabetic range)        Assessment & Plan:   1. Hypothyroidism Due to Hashimoto's thyroiditis She reports she has not taken her thyroid medication in over 3 months because the pharmacy would not refill it without first having an office visit. -She was advised to restart Levothyroxine 75 mcg po daily before breakfast.  Will recheck TFTs in 3 months and adjust dose if necessary.    - We discussed about the correct intake of her thyroid hormone, on empty stomach at fasting, with water, separated by at least 30 minutes from breakfast and other medications,  and separated by more than 4 hours from calcium, iron, multivitamins, acid  reflux medications (PPIs). -Patient is made aware of the fact that thyroid hormone replacement is needed for life, dose to be adjusted by periodic monitoring of thyroid function tests.  2. Essential hypertension she is advised to home monitor blood pressure and report if > 140/90 on 2 separate readings.  She has not been taking her BP medication since last visit for unknown reasons.  3. Hyperlipidemia - Her most recent lipid panel from 08/17/20 shows worsening LDL of 155 and elevated triglycerides of 163.  She has not been taking her Crestor since last visit.  She is advised to restart her Crestor 5 mg po daily at bedtime.  Side effects and precautions discussed with her.   4. Pre-diabetes -Her POCT A1c today is 6.3%, unchanged from previous visit.  She has not taken the Metformin as prescribed at last visit.  She says this medication causes her significant GI upset  with bloating and diarrhea.  She prefers to try to control her blood glucose with diet and exercise alone before trying alternative medications.  - Nutritional counseling repeated at each appointment due to patients tendency to fall back in to old habits.  - The patient admits there is a room for improvement in their diet and drink choices. -  Suggestion is made for the patient to avoid simple carbohydrates from their diet including Cakes, Sweet Desserts / Pastries, Ice Cream, Soda (diet and regular), Sweet Tea, Candies, Chips, Cookies, Sweet Pastries, Store Bought Juices, Alcohol in Excess of 1-2 drinks a day, Artificial Sweeteners, Coffee Creamer, and "Sugar-free" Products. This will help patient to have stable blood glucose profile and potentially avoid unintended weight gain.   - I encouraged the patient to switch to unprocessed or minimally processed complex starch and increased protein intake (animal or plant source), fruits, and vegetables.   - Patient is advised to stick to a routine mealtimes to eat 3 meals a day and avoid unnecessary snacks (to snack only to correct hypoglycemia).      I spent 40 minutes in the care of the patient today including review of labs from [place], Lipids, Thyroid Function, Hematology (current and previous including abstractions from other facilities); face-to-face time discussing  her blood glucose readings/logs, discussing hypoglycemia and hyperglycemia episodes and symptoms, medications doses, her options of short and long term treatment based on the latest standards of care / guidelines;  discussion about incorporating lifestyle medicine;  and documenting the encounter.    Please refer to Patient Instructions for Blood Glucose Monitoring and Insulin/Medications Dosing Guide"  in media tab for additional information. Please  also refer to " Patient Self Inventory" in the Media  tab for reviewed elements of pertinent patient history.  Vella Redhead participated  in the discussions, expressed understanding, and voiced agreement with the above plans.  All questions were answered to her satisfaction. she is encouraged to contact clinic should she have any questions or concerns prior to her return visit.   Follow up plan: Return in about 3 months (around 11/24/2020) for Thyroid follow up, Previsit labs.  Rayetta Pigg, [facility] [address] [place], [place] [address] Phone: [phone] Fax: [phone]  08/24/2020, 4:33 PM

## 2020-11-05 ENCOUNTER — Other Ambulatory Visit: Payer: Self-pay | Admitting: "Endocrinology

## 2020-11-05 DIAGNOSIS — E063 Autoimmune thyroiditis: Secondary | ICD-10-CM

## 2020-11-05 DIAGNOSIS — E038 Other specified hypothyroidism: Secondary | ICD-10-CM

## 2020-11-17 DIAGNOSIS — E063 Autoimmune thyroiditis: Secondary | ICD-10-CM | POA: Diagnosis not present

## 2020-11-17 DIAGNOSIS — E038 Other specified hypothyroidism: Secondary | ICD-10-CM | POA: Diagnosis not present

## 2020-11-18 LAB — TSH: TSH: 1.55 u[IU]/mL (ref 0.450–4.500)

## 2020-11-18 LAB — T4, FREE: Free T4: 1.49 ng/dL (ref 0.82–1.77)

## 2020-11-23 NOTE — Patient Instructions (Signed)

## 2020-11-24 ENCOUNTER — Other Ambulatory Visit: Payer: Self-pay

## 2020-11-24 ENCOUNTER — Encounter: Payer: Self-pay | Admitting: Nurse Practitioner

## 2020-11-24 ENCOUNTER — Ambulatory Visit: Payer: BC Managed Care – PPO | Admitting: Nurse Practitioner

## 2020-11-24 VITALS — BP 134/91 | HR 83 | Ht 62.0 in | Wt 166.6 lb

## 2020-11-24 DIAGNOSIS — R7303 Prediabetes: Secondary | ICD-10-CM

## 2020-11-24 DIAGNOSIS — I10 Essential (primary) hypertension: Secondary | ICD-10-CM

## 2020-11-24 DIAGNOSIS — E559 Vitamin D deficiency, unspecified: Secondary | ICD-10-CM

## 2020-11-24 DIAGNOSIS — E782 Mixed hyperlipidemia: Secondary | ICD-10-CM

## 2020-11-24 DIAGNOSIS — E038 Other specified hypothyroidism: Secondary | ICD-10-CM

## 2020-11-24 DIAGNOSIS — E063 Autoimmune thyroiditis: Secondary | ICD-10-CM

## 2020-11-24 MED ORDER — LEVOTHYROXINE SODIUM 75 MCG PO TABS: ORAL_TABLET | ORAL | 1 refills | Status: DC

## 2020-11-24 NOTE — Progress Notes (Signed)
11/24/2020                    Endocrinology Follow Up Visit  Subjective:    Patient ID: Renee Zamora, female    DOB: 20-Nov-1975, PCP Default, Provider, MD   Past Medical History:  Diagnosis Date  . Essential hypertension, benign   . Hypothyroidism (acquired)   . Mixed hyperlipidemia   . Prediabetes   . Vitamin D deficiency    History reviewed. No pertinent surgical history. Social History   Socioeconomic History  . Marital status: Single    Spouse name: Not on file  . Number of children: Not on file  . Years of education: Not on file  . Highest education level: Not on file  Occupational History  . Not on file  Tobacco Use  . Smoking status: Never  . Smokeless tobacco: Never  Vaping Use  . Vaping Use: Never used  Substance and Sexual Activity  . Alcohol use: No  . Drug use: No  . Sexual activity: Yes    Birth control/protection: Pill  Other Topics Concern  . Not on file  Social History Narrative  . Not on file   Social Determinants of Health   Financial Resource Strain: Not on file  Food Insecurity: Not on file  Transportation Needs: Not on file  Physical Activity: Not on file  Stress: Not on file  Social Connections: Not on file   Outpatient Encounter Medications as of 11/24/2020  Medication Sig  . rosuvastatin (CRESTOR) 5 MG tablet Take 1 tablet (5 mg total) by mouth daily.  . [DISCONTINUED] levothyroxine (SYNTHROID) 75 MCG tablet TAKE 1 TABLET BY MOUTH DAILY BEFORE BREAKFAST.  Marland Kitchen levothyroxine (SYNTHROID) 75 MCG tablet TAKE 1 TABLET BY MOUTH DAILY BEFORE BREAKFAST.   No facility-administered encounter medications on file as of 11/24/2020.   ALLERGIES: No Known Allergies VACCINATION STATUS:  There is no immunization history on file for this patient.  Thyroid Problem Presents for follow-up visit. Patient reports no anxiety, cold intolerance, constipation, depressed mood, fatigue, hair loss, heat intolerance, leg swelling, palpitations, tremors, weight  gain or weight loss. The symptoms have been stable. Her past medical history is significant for hyperlipidemia.  Hyperlipidemia This is a chronic problem. The current episode started more than 1 year ago. The problem is uncontrolled. Recent lipid tests were reviewed and are high. Exacerbating diseases include hypothyroidism and obesity. Factors aggravating her hyperlipidemia include fatty foods. Current antihyperlipidemic treatment includes statins. Compliance problems include adherence to exercise and adherence to diet.  Risk factors for coronary artery disease include hypertension, dyslipidemia and a sedentary lifestyle.  Hypertension This is a chronic problem. The current episode started more than 1 year ago. The problem has been gradually improving since onset. The problem is controlled. Pertinent negatives include no palpitations. There are no associated agents to hypertension. Risk factors for coronary artery disease include dyslipidemia, obesity and sedentary lifestyle. Past treatments include nothing (has not been taking her BP meds as prescribed). Compliance problems include diet, exercise and psychosocial issues.  Identifiable causes of hypertension include a thyroid problem.   Renee Zamora is a 45 year old female patient with medical history of hypothyroidism due to Hashimoto's thyroiditis, hypertension, hyperlipidemia, prediabetes, and vitamin D deficiency.    - she denies family history of thyroid dysfunction. she denies personal history of goiter.   Review of systems  Constitutional: + Minimally fluctuating body weight,  current Body mass index is 30.47 kg/m. , no fatigue, no subjective hyperthermia, no subjective hypothermia  Eyes: no blurry vision, no xerophthalmia ENT: no sore throat, no nodules palpated in throat, no dysphagia/odynophagia, no hoarseness Cardiovascular: no chest pain, no shortness of breath, no palpitations, no leg swelling Respiratory: no cough, no shortness of  breath Gastrointestinal: no nausea/vomiting/diarrhea Musculoskeletal: no muscle/joint aches Skin: no rashes, no hyperemia Neurological: no tremors, no numbness, no tingling, no dizziness Psychiatric: no depression, no anxiety  Objective:    BP (!) 134/91   Pulse 83   Ht 5\' 2"  (1.575 m)   Wt 166 lb 9.6 oz (75.6 kg)   BMI 30.47 kg/m   Wt Readings from Last 3 Encounters:  11/24/20 166 lb 9.6 oz (75.6 kg)  08/24/20 168 lb (76.2 kg)  09/01/18 163 lb (73.9 kg)    BP Readings from Last 3 Encounters:  11/24/20 (!) 134/91  08/24/20 (!) 147/83  09/01/18 (!) 144/83     Physical Exam- Limited  Constitutional:  Body mass index is 30.47 kg/m. , not in acute distress, normal state of mind Eyes:  EOMI, no exophthalmos Neck: Supple Cardiovascular: RRR, no murmurs, rubs, or gallops, no edema Respiratory: Adequate breathing efforts, no crackles, rales, rhonchi, or wheezing Musculoskeletal: no gross deformities, strength intact in all four extremities, no gross restriction of joint movements Skin:  no rashes, no hyperemia Neurological: no tremor with outstretched hands   Recent Results (from the past 2160 hour(s))  TSH     Status: None   Collection Time: 11/17/20  3:32 PM  Result Value Ref Range   TSH 1.550 0.450 - 4.500 uIU/mL  T4, free     Status: None   Collection Time: 11/17/20  3:32 PM  Result Value Ref Range   Free T4 1.49 0.82 - 1.77 ng/dL      Assessment & Plan:   1. Hypothyroidism Due to Hashimoto's thyroiditis Her previsit thyroid function tests are consistent with appropriate hormone replacement.  She is advised to continue Levothyroxine 75 mcg po daily before breakfast.    - We discussed about the correct intake of her thyroid hormone, on empty stomach at fasting, with water, separated by at least 30 minutes from breakfast and other medications,  and separated by more than 4 hours from calcium, iron, multivitamins, acid reflux medications (PPIs). -Patient is made  aware of the fact that thyroid hormone replacement is needed for life, dose to be adjusted by periodic monitoring of thyroid function tests.  2. Essential hypertension Her blood pressure is controlled to target today.  she is advised to home monitor blood pressure and report if > 140/90 on 2 separate readings.  She has not been taking her BP medication since last visit for unknown reasons.  3. Hyperlipidemia - Her most recent lipid panel from 08/17/20 shows worsening LDL of 155 and elevated triglycerides of 163.  She is advised to continue Crestor 5 mg po daily at bedtime.  Side effects and precautions discussed with her.   4. Pre-diabetes -Her most recent A1c from 08/24/20 was 6.3%, unchanged from previous visit.  She has not taken the Metformin as prescribed at last visit.  She says this medication causes her significant GI upset with bloating and diarrhea.  She prefers to try to control her blood glucose with diet and exercise alone before trying alternative medications.  - Nutritional counseling repeated at each appointment due to patients tendency to fall back in to old habits.  - The patient admits there is a room for improvement in their diet and drink choices. -  Suggestion is made  for the patient to avoid simple carbohydrates from their diet including Cakes, Sweet Desserts / Pastries, Ice Cream, Soda (diet and regular), Sweet Tea, Candies, Chips, Cookies, Sweet Pastries, Store Bought Juices, Alcohol in Excess of 1-2 drinks a day, Artificial Sweeteners, Coffee Creamer, and "Sugar-free" Products. This will help patient to have stable blood glucose profile and potentially avoid unintended weight gain.   - I encouraged the patient to switch to unprocessed or minimally processed complex starch and increased protein intake (animal or plant source), fruits, and vegetables.   - Patient is advised to stick to a routine mealtimes to eat 3 meals a day and avoid unnecessary snacks (to snack only to  correct hypoglycemia).        I spent 20 minutes in the care of the patient today including review of labs from CMP, Lipids, Thyroid Function, Hematology (current and previous including abstractions from other facilities); face-to-face time discussing  her blood glucose readings/logs, discussing hypoglycemia and hyperglycemia episodes and symptoms, medications doses, her options of short and long term treatment based on the latest standards of care / guidelines;  discussion about incorporating lifestyle medicine;  and documenting the encounter.    Please refer to Patient Instructions for Blood Glucose Monitoring and Insulin/Medications Dosing Guide"  in media tab for additional information. Please  also refer to " Patient Self Inventory" in the Media  tab for reviewed elements of pertinent patient history.  Bobbye Charleston participated in the discussions, expressed understanding, and voiced agreement with the above plans.  All questions were answered to her satisfaction. she is encouraged to contact clinic should she have any questions or concerns prior to her return visit.   Follow up plan: Return in about 4 months (around 03/26/2021) for Thyroid follow up, Diabetes F/U with A1c in office, Previsit labs.  Ronny Bacon, [facility] [address] [place], [place] [address] Phone: [phone] Fax: [phone]  11/24/2020, 3:54 PM

## 2021-02-20 ENCOUNTER — Other Ambulatory Visit: Payer: Self-pay | Admitting: Nurse Practitioner

## 2021-02-20 DIAGNOSIS — E782 Mixed hyperlipidemia: Secondary | ICD-10-CM

## 2021-03-27 ENCOUNTER — Ambulatory Visit: Payer: BC Managed Care – PPO | Admitting: Nurse Practitioner

## 2021-03-27 DIAGNOSIS — E038 Other specified hypothyroidism: Secondary | ICD-10-CM | POA: Diagnosis not present

## 2021-03-27 DIAGNOSIS — E063 Autoimmune thyroiditis: Secondary | ICD-10-CM | POA: Diagnosis not present

## 2021-03-28 LAB — T4, FREE: Free T4: 1.47 ng/dL (ref 0.82–1.77)

## 2021-03-28 LAB — TSH: TSH: 0.964 u[IU]/mL (ref 0.450–4.500)

## 2021-04-10 ENCOUNTER — Encounter: Payer: Self-pay | Admitting: Nurse Practitioner

## 2021-04-10 ENCOUNTER — Other Ambulatory Visit: Payer: Self-pay

## 2021-04-10 ENCOUNTER — Ambulatory Visit: Payer: BC Managed Care – PPO | Admitting: Nurse Practitioner

## 2021-04-10 VITALS — BP 151/96 | HR 90 | Ht 62.0 in | Wt 161.0 lb

## 2021-04-10 DIAGNOSIS — E782 Mixed hyperlipidemia: Secondary | ICD-10-CM | POA: Diagnosis not present

## 2021-04-10 DIAGNOSIS — R7303 Prediabetes: Secondary | ICD-10-CM

## 2021-04-10 DIAGNOSIS — E038 Other specified hypothyroidism: Secondary | ICD-10-CM | POA: Diagnosis not present

## 2021-04-10 DIAGNOSIS — E559 Vitamin D deficiency, unspecified: Secondary | ICD-10-CM | POA: Diagnosis not present

## 2021-04-10 DIAGNOSIS — E063 Autoimmune thyroiditis: Secondary | ICD-10-CM

## 2021-04-10 DIAGNOSIS — I10 Essential (primary) hypertension: Secondary | ICD-10-CM

## 2021-04-10 LAB — POCT GLYCOSYLATED HEMOGLOBIN (HGB A1C): HbA1c, POC (controlled diabetic range): 6.1 % (ref 0.0–7.0)

## 2021-04-10 MED ORDER — LEVOTHYROXINE SODIUM 75 MCG PO TABS: ORAL_TABLET | ORAL | 3 refills | Status: DC

## 2021-04-10 NOTE — Progress Notes (Signed)
04/10/2021                    Endocrinology Follow Up Visit  Subjective:    Patient ID: Renee Zamora, female    DOB: 13-Feb-1976, PCP Default, Provider, MD   Past Medical History:  Diagnosis Date   Essential hypertension, benign    Hypothyroidism (acquired)    Mixed hyperlipidemia    Prediabetes    Vitamin D deficiency    History reviewed. No pertinent surgical history. Social History   Socioeconomic History   Marital status: Single    Spouse name: Not on file   Number of children: Not on file   Years of education: Not on file   Highest education level: Not on file  Occupational History   Not on file  Tobacco Use   Smoking status: Never   Smokeless tobacco: Never  Vaping Use   Vaping Use: Never used  Substance and Sexual Activity   Alcohol use: No   Drug use: No   Sexual activity: Yes    Birth control/protection: Pill  Other Topics Concern   Not on file  Social History Narrative   Not on file   Social Determinants of Health   Financial Resource Strain: Not on file  Food Insecurity: Not on file  Transportation Needs: Not on file  Physical Activity: Not on file  Stress: Not on file  Social Connections: Not on file   Outpatient Encounter Medications as of 04/10/2021  Medication Sig   levothyroxine (SYNTHROID) 75 MCG tablet TAKE 1 TABLET BY MOUTH DAILY BEFORE BREAKFAST.   rosuvastatin (CRESTOR) 5 MG tablet TAKE 1 TABLET (5 MG TOTAL) BY MOUTH DAILY.   [DISCONTINUED] levothyroxine (SYNTHROID) 75 MCG tablet TAKE 1 TABLET BY MOUTH DAILY BEFORE BREAKFAST.   No facility-administered encounter medications on file as of 04/10/2021.   ALLERGIES: No Known Allergies VACCINATION STATUS:  There is no immunization history on file for this patient.  Thyroid Problem Presents for follow-up visit. Patient reports no anxiety, cold intolerance, constipation, depressed mood, fatigue, hair loss, heat intolerance, leg swelling, palpitations, tremors, weight gain or weight loss. The  symptoms have been stable. Her past medical history is significant for hyperlipidemia.  Hyperlipidemia This is a chronic problem. The current episode started more than 1 year ago. The problem is uncontrolled. Recent lipid tests were reviewed and are high. Exacerbating diseases include hypothyroidism and obesity. Factors aggravating her hyperlipidemia include fatty foods. Current antihyperlipidemic treatment includes statins. Compliance problems include adherence to exercise and adherence to diet.  Risk factors for coronary artery disease include hypertension, a sedentary lifestyle and dyslipidemia.  Hypertension This is a chronic problem. The current episode started more than 1 year ago. The problem has been waxing and waning since onset. The problem is uncontrolled. Pertinent negatives include no palpitations. There are no associated agents to hypertension. Risk factors for coronary artery disease include dyslipidemia, obesity and sedentary lifestyle. Past treatments include nothing (has not been taking her BP meds as prescribed). Compliance problems include diet, exercise and psychosocial issues.  Identifiable causes of hypertension include a thyroid problem.   Mrs. Renee Zamora is a 45 year old female patient with medical history of hypothyroidism due to Hashimoto's thyroiditis, hypertension, hyperlipidemia, prediabetes, and vitamin D deficiency.    - she denies family history of thyroid dysfunction. she denies personal history of goiter.   Review of systems  Constitutional: + Minimally fluctuating body weight,  current Body mass index is 29.45 kg/m. , no fatigue, no subjective hyperthermia, no subjective  hypothermia Eyes: no blurry vision, no xerophthalmia ENT: no sore throat, no nodules palpated in throat, no dysphagia/odynophagia, no hoarseness Cardiovascular: no chest pain, no shortness of breath, no palpitations, no leg swelling Respiratory: no cough, no shortness of breath Gastrointestinal: no  nausea/vomiting/diarrhea Musculoskeletal: no muscle/joint aches Skin: no rashes, no hyperemia Neurological: no tremors, no numbness, no tingling, no dizziness Psychiatric: no depression, no anxiety  Objective:    BP (!) 151/96    Pulse 90    Ht 5\' 2"  (1.575 m)    Wt 161 lb (73 kg)    BMI 29.45 kg/m   Wt Readings from Last 3 Encounters:  04/10/21 161 lb (73 kg)  11/24/20 166 lb 9.6 oz (75.6 kg)  08/24/20 168 lb (76.2 kg)    BP Readings from Last 3 Encounters:  04/10/21 (!) 151/96  11/24/20 (!) 134/91  08/24/20 (!) 147/83     Physical Exam- Limited  Constitutional:  Body mass index is 29.45 kg/m. , not in acute distress, normal state of mind Eyes:  EOMI, no exophthalmos Neck: Supple Cardiovascular: RRR, no murmurs, rubs, or gallops, no edema Respiratory: Adequate breathing efforts, no crackles, rales, rhonchi, or wheezing Musculoskeletal: no gross deformities, strength intact in all four extremities, no gross restriction of joint movements Skin:  no rashes, no hyperemia Neurological: no tremor with outstretched hands   Recent Results (from the past 2160 hour(s))  TSH     Status: None   Collection Time: 03/27/21  4:18 PM  Result Value Ref Range   TSH 0.964 0.450 - 4.500 uIU/mL  T4, free     Status: None   Collection Time: 03/27/21  4:18 PM  Result Value Ref Range   Free T4 1.47 0.82 - 1.77 ng/dL  HgB D8Y     Status: None   Collection Time: 04/10/21  4:04 PM  Result Value Ref Range   Hemoglobin A1C     HbA1c POC (<> result, manual entry)     HbA1c, POC (prediabetic range)     HbA1c, POC (controlled diabetic range) 6.1 0.0 - 7.0 %     Latest Reference Range & Units 08/17/20 15:22 11/17/20 15:32 03/27/21 16:18  TSH 0.450 - 4.500 uIU/mL 2.920 1.550 0.964  T4,Free(Direct) 0.82 - 1.77 ng/dL 6.41 5.83 0.94    Assessment & Plan:   1. Hypothyroidism due to Hashimoto's thyroiditis Her previsit thyroid function tests are consistent with appropriate hormone replacement.   She is advised to continue Levothyroxine 75 mcg po daily before breakfast.    - We discussed about the correct intake of her thyroid hormone, on empty stomach at fasting, with water, separated by at least 30 minutes from breakfast and other medications,  and separated by more than 4 hours from calcium, iron, multivitamins, acid reflux medications (PPIs). -Patient is made aware of the fact that thyroid hormone replacement is needed for life, dose to be adjusted by periodic monitoring of thyroid function tests.  2. Essential hypertension Her blood pressure is NOT controlled to target today.  she is advised to home monitor blood pressure and report if > 140/90 on 2 separate readings.  She has not been taking her BP medication since last visit for unknown reasons.  She is advised to adopt a DASH diet to assist.  3. Hyperlipidemia - Her most recent lipid panel from 08/17/20 shows worsening LDL of 155 and elevated triglycerides of 163.  She is advised to continue Crestor 5 mg po daily at bedtime.  Side effects and precautions discussed  with her. Will recheck lipid panel prior to next visit.  4. Pre-diabetes -Her POCT A1c today is 6.1%, improving from last visit of 6.3%, still in the prediabetes category.   She does not tolerate Metformin, says this medication causes her significant GI upset with bloating and diarrhea.  She prefers to try to control her blood glucose with diet and exercise alone before trying alternative medications.  She can stay off medications at this time.  May consider incretin therapy in the future.  - Nutritional counseling repeated at each appointment due to patients tendency to fall back in to old habits.  - The patient admits there is a room for improvement in their diet and drink choices. -  Suggestion is made for the patient to avoid simple carbohydrates from their diet including Cakes, Sweet Desserts / Pastries, Ice Cream, Soda (diet and regular), Sweet Tea, Candies, Chips,  Cookies, Sweet Pastries, Store Bought Juices, Alcohol in Excess of 1-2 drinks a day, Artificial Sweeteners, Coffee Creamer, and "Sugar-free" Products. This will help patient to have stable blood glucose profile and potentially avoid unintended weight gain.   - I encouraged the patient to switch to unprocessed or minimally processed complex starch and increased protein intake (animal or plant source), fruits, and vegetables.   - Patient is advised to stick to a routine mealtimes to eat 3 meals a day and avoid unnecessary snacks (to snack only to correct hypoglycemia).       I spent 20 minutes in the care of the patient today including review of labs from CMP, Lipids, Thyroid Function, Hematology (current and previous including abstractions from other facilities); face-to-face time discussing  her blood glucose readings/logs, discussing hypoglycemia and hyperglycemia episodes and symptoms, medications doses, her options of short and long term treatment based on the latest standards of care / guidelines;  discussion about incorporating lifestyle medicine;  and documenting the encounter.    Please refer to Patient Instructions for Blood Glucose Monitoring and Insulin/Medications Dosing Guide"  in media tab for additional information. Please  also refer to " Patient Self Inventory" in the Media  tab for reviewed elements of pertinent patient history.  Bobbye Charleston participated in the discussions, expressed understanding, and voiced agreement with the above plans.  All questions were answered to her satisfaction. she is encouraged to contact clinic should she have any questions or concerns prior to her return visit.   Follow up plan: Return in about 4 months (around 08/08/2021) for Thyroid follow up, Previsit labs, Diabetes F/U with A1c in office.  Ronny Bacon, [facility] [address] [place], [place] [address] Phone: [phone] Fax:  [phone]  04/10/2021, 4:15 PM

## 2021-04-10 NOTE — Patient Instructions (Signed)

## 2021-08-08 ENCOUNTER — Ambulatory Visit: Payer: BC Managed Care – PPO | Admitting: Nurse Practitioner

## 2021-08-09 DIAGNOSIS — E063 Autoimmune thyroiditis: Secondary | ICD-10-CM | POA: Diagnosis not present

## 2021-08-09 DIAGNOSIS — E038 Other specified hypothyroidism: Secondary | ICD-10-CM | POA: Diagnosis not present

## 2021-08-09 DIAGNOSIS — R7303 Prediabetes: Secondary | ICD-10-CM | POA: Diagnosis not present

## 2021-08-09 DIAGNOSIS — E559 Vitamin D deficiency, unspecified: Secondary | ICD-10-CM | POA: Diagnosis not present

## 2021-08-10 LAB — COMPREHENSIVE METABOLIC PANEL
ALT: 17 IU/L (ref 0–32)
AST: 19 IU/L (ref 0–40)
Albumin/Globulin Ratio: 1.6 (ref 1.2–2.2)
Albumin: 4.5 g/dL (ref 3.8–4.8)
Alkaline Phosphatase: 91 IU/L (ref 44–121)
BUN/Creatinine Ratio: 10 (ref 9–23)
BUN: 7 mg/dL (ref 6–24)
Bilirubin Total: 0.6 mg/dL (ref 0.0–1.2)
CO2: 23 mmol/L (ref 20–29)
Calcium: 8.7 mg/dL (ref 8.7–10.2)
Chloride: 100 mmol/L (ref 96–106)
Creatinine, Ser: 0.67 mg/dL (ref 0.57–1.00)
Globulin, Total: 2.8 g/dL (ref 1.5–4.5)
Glucose: 80 mg/dL (ref 70–99)
Potassium: 3.8 mmol/L (ref 3.5–5.2)
Sodium: 141 mmol/L (ref 134–144)
Total Protein: 7.3 g/dL (ref 6.0–8.5)
eGFR: 109 mL/min/{1.73_m2} (ref >59)

## 2021-08-10 LAB — TSH: TSH: 1.24 u[IU]/mL (ref 0.450–4.500)

## 2021-08-10 LAB — T4, FREE: Free T4: 1.48 ng/dL (ref 0.82–1.77)

## 2021-08-10 LAB — LIPID PANEL
Chol/HDL Ratio: 4.7 ratio — ABNORMAL HIGH (ref 0.0–4.4)
Cholesterol, Total: 226 mg/dL — ABNORMAL HIGH (ref 100–199)
HDL: 48 mg/dL (ref >39)
LDL Chol Calc (NIH): 147 mg/dL — ABNORMAL HIGH (ref 0–99)
Triglycerides: 174 mg/dL — ABNORMAL HIGH (ref 0–149)
VLDL Cholesterol Cal: 31 mg/dL (ref 5–40)

## 2021-08-10 LAB — VITAMIN D 25 HYDROXY (VIT D DEFICIENCY, FRACTURES): Vit D, 25-Hydroxy: 22.3 ng/mL — ABNORMAL LOW (ref 30.0–100.0)

## 2021-08-28 ENCOUNTER — Encounter: Payer: Self-pay | Admitting: Nurse Practitioner

## 2021-08-28 ENCOUNTER — Ambulatory Visit: Payer: BC Managed Care – PPO | Admitting: Nurse Practitioner

## 2021-08-28 VITALS — BP 132/85 | HR 84 | Ht 62.0 in | Wt 164.0 lb

## 2021-08-28 DIAGNOSIS — E559 Vitamin D deficiency, unspecified: Secondary | ICD-10-CM

## 2021-08-28 DIAGNOSIS — R7303 Prediabetes: Secondary | ICD-10-CM

## 2021-08-28 DIAGNOSIS — E782 Mixed hyperlipidemia: Secondary | ICD-10-CM | POA: Diagnosis not present

## 2021-08-28 DIAGNOSIS — I10 Essential (primary) hypertension: Secondary | ICD-10-CM

## 2021-08-28 DIAGNOSIS — E038 Other specified hypothyroidism: Secondary | ICD-10-CM

## 2021-08-28 DIAGNOSIS — E063 Autoimmune thyroiditis: Secondary | ICD-10-CM

## 2021-08-28 LAB — POCT GLYCOSYLATED HEMOGLOBIN (HGB A1C): HbA1c, POC (controlled diabetic range): 6.1 % (ref 0.0–7.0)

## 2022-02-19 DIAGNOSIS — E038 Other specified hypothyroidism: Secondary | ICD-10-CM | POA: Diagnosis not present

## 2022-02-19 DIAGNOSIS — E063 Autoimmune thyroiditis: Secondary | ICD-10-CM | POA: Diagnosis not present

## 2022-02-20 LAB — TSH: TSH: 0.965 u[IU]/mL (ref 0.450–4.500)

## 2022-02-20 LAB — T4, FREE: Free T4: 1.66 ng/dL (ref 0.82–1.77)

## 2022-02-28 ENCOUNTER — Encounter: Payer: Self-pay | Admitting: Nurse Practitioner

## 2022-02-28 ENCOUNTER — Ambulatory Visit: Payer: BC Managed Care – PPO | Admitting: Nurse Practitioner

## 2022-02-28 VITALS — BP 148/90 | HR 88 | Ht 60.0 in | Wt 165.4 lb

## 2022-02-28 DIAGNOSIS — E782 Mixed hyperlipidemia: Secondary | ICD-10-CM

## 2022-02-28 DIAGNOSIS — R7303 Prediabetes: Secondary | ICD-10-CM

## 2022-02-28 DIAGNOSIS — I10 Essential (primary) hypertension: Secondary | ICD-10-CM

## 2022-02-28 DIAGNOSIS — E559 Vitamin D deficiency, unspecified: Secondary | ICD-10-CM

## 2022-02-28 DIAGNOSIS — E063 Autoimmune thyroiditis: Secondary | ICD-10-CM

## 2022-02-28 DIAGNOSIS — E038 Other specified hypothyroidism: Secondary | ICD-10-CM | POA: Diagnosis not present

## 2022-02-28 LAB — POCT GLYCOSYLATED HEMOGLOBIN (HGB A1C): Hemoglobin A1C: 6.1 % — AB (ref 4.0–5.6)

## 2022-02-28 MED ORDER — LEVOTHYROXINE SODIUM 75 MCG PO TABS: ORAL_TABLET | ORAL | 3 refills | Status: DC

## 2022-02-28 NOTE — Patient Instructions (Signed)

## 2022-02-28 NOTE — Progress Notes (Signed)
02/28/2022                    Endocrinology Follow Up Visit  Subjective:    Patient ID: Renee Zamora, female    DOB: 28-Jan-1976, PCP Default, Provider, MD   Past Medical History:  Diagnosis Date   Essential hypertension, benign    Hypothyroidism (acquired)    Mixed hyperlipidemia    Prediabetes    Vitamin D deficiency    History reviewed. No pertinent surgical history. Social History   Socioeconomic History   Marital status: Single    Spouse name: Not on file   Number of children: Not on file   Years of education: Not on file   Highest education level: Not on file  Occupational History   Not on file  Tobacco Use   Smoking status: Never   Smokeless tobacco: Never  Vaping Use   Vaping Use: Never used  Substance and Sexual Activity   Alcohol use: No   Drug use: No   Sexual activity: Yes    Birth control/protection: Pill  Other Topics Concern   Not on file  Social History Narrative   Not on file   Social Determinants of Health   Financial Resource Strain: Not on file  Food Insecurity: Not on file  Transportation Needs: Not on file  Physical Activity: Not on file  Stress: Not on file  Social Connections: Not on file   Outpatient Encounter Medications as of 02/28/2022  Medication Sig   [DISCONTINUED] levothyroxine (SYNTHROID) 75 MCG tablet TAKE 1 TABLET BY MOUTH DAILY BEFORE BREAKFAST.   levothyroxine (SYNTHROID) 75 MCG tablet TAKE 1 TABLET BY MOUTH DAILY BEFORE BREAKFAST.   [DISCONTINUED] rosuvastatin (CRESTOR) 5 MG tablet TAKE 1 TABLET (5 MG TOTAL) BY MOUTH DAILY. (Patient not taking: Reported on 08/28/2021)   No facility-administered encounter medications on file as of 02/28/2022.   ALLERGIES: No Known Allergies VACCINATION STATUS:  There is no immunization history on file for this patient.  Thyroid Problem Presents for follow-up visit. Patient reports no anxiety, cold intolerance, constipation, depressed mood, fatigue, hair loss, heat intolerance, leg  swelling, palpitations, tremors, weight gain or weight loss. The symptoms have been stable. Her past medical history is significant for hyperlipidemia.  Hyperlipidemia This is a chronic problem. The current episode started more than 1 year ago. The problem is uncontrolled. Recent lipid tests were reviewed and are high. Exacerbating diseases include hypothyroidism and obesity. Factors aggravating her hyperlipidemia include fatty foods. Current antihyperlipidemic treatment includes statins. Compliance problems include adherence to exercise and adherence to diet.  Risk factors for coronary artery disease include hypertension, a sedentary lifestyle and dyslipidemia.  Hypertension This is a chronic problem. The current episode started more than 1 year ago. The problem has been waxing and waning since onset. The problem is uncontrolled. Pertinent negatives include no palpitations. There are no associated agents to hypertension. Risk factors for coronary artery disease include dyslipidemia, obesity and sedentary lifestyle. Past treatments include nothing (has not been taking her BP meds as prescribed). Compliance problems include diet, exercise and psychosocial issues.  Identifiable causes of hypertension include a thyroid problem.    [name] is a 46 year old female patient with medical history of hypothyroidism due to Hashimoto's thyroiditis, hypertension, hyperlipidemia, prediabetes, and vitamin D deficiency.    - she denies family history of thyroid dysfunction. she denies personal history of goiter.   Review of systems  Constitutional: + Minimally fluctuating body weight,  current Body mass index is 32.3 kg/m. ,  no fatigue, no subjective hyperthermia, no subjective hypothermia Eyes: no blurry vision, no xerophthalmia ENT: no sore throat, no nodules palpated in throat, no dysphagia/odynophagia, no hoarseness Cardiovascular: no chest pain, no shortness of breath, no palpitations, no leg  swelling Respiratory: no cough, no shortness of breath Gastrointestinal: no nausea/vomiting/diarrhea Musculoskeletal: no muscle/joint aches Skin: no rashes, no hyperemia Neurological: no tremors, no numbness, no tingling, no dizziness Psychiatric: no depression, no anxiety  Objective:    BP (!) 148/90 (BP Location: Right Arm, Patient Position: Sitting, Cuff Size: Normal) Comment: Patient was advised agter to elevated readings to see PCP for further evaluation of Blood Pressure  Pulse 88   Ht 5' (1.524 m)   Wt 165 lb 6.4 oz (75 kg)   BMI 32.30 kg/m   Wt Readings from Last 3 Encounters:  02/28/22 165 lb 6.4 oz (75 kg)  08/28/21 164 lb (74.4 kg)  04/10/21 161 lb (73 kg)    BP Readings from Last 3 Encounters:  02/28/22 (!) 148/90  08/28/21 132/85  04/10/21 (!) 151/96     Physical Exam- Limited  Constitutional:  Body mass index is 32.3 kg/m. , not in acute distress, normal state of mind Eyes:  EOMI, no exophthalmos Musculoskeletal: no gross deformities, strength intact in all four extremities, no gross restriction of joint movements Skin:  no rashes, no hyperemia Neurological: no tremor with outstretched hands   Diabetic Foot Exam - Simple   No data filed     Recent Results (from the past 2160 hour(s))  TSH     Status: None   Collection Time: 02/19/22  4:21 PM  Result Value Ref Range   TSH 0.965 0.450 - 4.500 uIU/mL  T4, free     Status: None   Collection Time: 02/19/22  4:21 PM  Result Value Ref Range   Free T4 1.66 0.82 - 1.77 ng/dL  HgB E9H     Status: Abnormal   Collection Time: 02/28/22  4:25 PM  Result Value Ref Range   Hemoglobin A1C 6.1 (A) 4.0 - 5.6 %   HbA1c POC (<> result, manual entry)     HbA1c, POC (prediabetic range)     HbA1c, POC (controlled diabetic range)       Latest Reference Range & Units 08/18/18 11:45 08/17/20 15:22 11/17/20 15:32 03/27/21 16:18 08/09/21 16:16 02/19/22 16:21  TSH 0.450 - 4.500 uIU/mL 0.37 (L) 2.920 1.550 0.964 1.240  0.965  T4,Free(Direct) 0.82 - 1.77 ng/dL 1.4 3.71 6.96 7.89 3.81 1.66  (L): Data is abnormally low   Assessment & Plan:   1. Hypothyroidism due to Hashimoto's thyroiditis Her previsit thyroid function tests are consistent with appropriate hormone replacement.  She is advised to continue Levothyroxine 75 mcg po daily before breakfast.    - We discussed about the correct intake of her thyroid hormone, on empty stomach at fasting, with water, separated by at least 30 minutes from breakfast and other medications,  and separated by more than 4 hours from calcium, iron, multivitamins, acid reflux medications (PPIs). -Patient is made aware of the fact that thyroid hormone replacement is needed for life, dose to be adjusted by periodic monitoring of thyroid function tests.  2. Essential hypertension Her blood pressure is controlled to target today.  she is advised to home monitor blood pressure and report if > 140/90 on 2 separate readings.  She has not been taking her BP medication since last visit for unknown reasons.  She is advised to adopt a DASH diet to assist.  3. Hyperlipidemia - Her most recent lipid panel from 08/09/21 shows worsening LDL of 147 and elevated triglycerides of 174, slightly worse.  She has not been taking her Crestor for unknown reasons.  Will recheck prior to next visit.  4. Vitamin D Deficiency Her recent vitamin d level on 08/09/21 was low at 22.  She does not take any supplementation- was previously on OTC meds but stopped after she finished 1 bottle.  I advised her to restart OTC Vitamin D3 5000 units daily.  Will recheck vitamin D prior to next visit.  5. Pre-diabetes -Her POCT A1c today is 6.1%, stable from last visit, still in the prediabetes category.   She does not tolerate Metformin, says this medication causes her significant GI upset with bloating and diarrhea.  She prefers to try to control her blood glucose with diet and exercise alone before trying alternative  medications.  She can stay off medications at this time.  May consider incretin therapy in the future.  The following Lifestyle Medicine recommendations according to American College of Lifestyle [employer] [facility]([employer]LM) were discussed and offered to patient and she agrees to start the journey:  A. Whole Foods, Plant-based plate comprising of fruits and vegetables, plant-based proteins, whole-grain carbohydrates was discussed in detail with the patient.   A list for source of those nutrients were also provided to the patient.  Patient will use only water or unsweetened tea for hydration. B.  The need to stay away from risky substances including alcohol, smoking; obtaining 7 to 9 hours of restorative sleep, at least 150 minutes of moderate intensity exercise weekly, the importance of healthy social connections,  and stress reduction techniques were discussed. C.  A full color page of  Calorie density of various food groups per pound showing examples of each food groups was provided to the patient.  - Nutritional counseling repeated at each appointment due to patients tendency to fall back in to old habits.  - The patient admits there is a room for improvement in their diet and drink choices. -  Suggestion is made for the patient to avoid simple carbohydrates from their diet including Cakes, Sweet Desserts / Pastries, Ice Cream, Soda (diet and regular), Sweet Tea, Candies, Chips, Cookies, Sweet Pastries, Store Bought Juices, Alcohol in Excess of 1-2 drinks a day, Artificial Sweeteners, Coffee Creamer, and "Sugar-free" Products. This will help patient to have stable blood glucose profile and potentially avoid unintended weight gain.   - I encouraged the patient to switch to unprocessed or minimally processed complex starch and increased protein intake (animal or plant source), fruits, and vegetables.   - Patient is advised to stick to a routine mealtimes to eat 3 meals a day and avoid unnecessary snacks (to snack  only to correct hypoglycemia).      I spent 32 minutes in the care of the patient today including review of labs from CMP, Lipids, Thyroid Function, Hematology (current and previous including abstractions from other facilities); face-to-face time discussing  her blood glucose readings/logs, discussing hypoglycemia and hyperglycemia episodes and symptoms, medications doses, her options of short and long term treatment based on the latest standards of care / guidelines;  discussion about incorporating lifestyle medicine;  and documenting the encounter. Risk reduction counseling performed per USPSTF guidelines to reduce obesity and cardiovascular risk factors.     Please refer to Patient Instructions for Blood Glucose Monitoring and Insulin/Medications Dosing Guide"  in media tab for additional information. Please  also refer to " Patient Self  Inventory" in the Media  tab for reviewed elements of pertinent patient history.  Bobbye Charleston participated in the discussions, expressed understanding, and voiced agreement with the above plans.  All questions were answered to her satisfaction. she is encouraged to contact clinic should she have any questions or concerns prior to her return visit.   Follow up plan: Return in about 6 months (around 08/30/2022) for Diabetes F/U with A1c in office, Thyroid follow up, Previsit labs.  Ronny Bacon, [facility] [address] [place], [place] [address] Phone: [phone] Fax: [phone]  02/28/2022, 4:29 PM

## 2022-08-27 NOTE — Patient Instructions (Signed)

## 2022-09-03 ENCOUNTER — Ambulatory Visit: Payer: BC Managed Care – PPO | Admitting: Nurse Practitioner

## 2022-09-03 DIAGNOSIS — E038 Other specified hypothyroidism: Secondary | ICD-10-CM | POA: Diagnosis not present

## 2022-09-03 DIAGNOSIS — E063 Autoimmune thyroiditis: Secondary | ICD-10-CM | POA: Diagnosis not present

## 2022-09-03 DIAGNOSIS — R7303 Prediabetes: Secondary | ICD-10-CM | POA: Diagnosis not present

## 2022-09-03 DIAGNOSIS — E559 Vitamin D deficiency, unspecified: Secondary | ICD-10-CM | POA: Diagnosis not present

## 2022-09-04 LAB — COMPREHENSIVE METABOLIC PANEL
ALT: 13 IU/L (ref 0–32)
AST: 18 IU/L (ref 0–40)
Albumin: 4.3 g/dL (ref 3.9–4.9)
Alkaline Phosphatase: 79 IU/L (ref 44–121)
BUN/Creatinine Ratio: 14 (ref 9–23)
BUN: 10 mg/dL (ref 6–24)
Bilirubin Total: 0.8 mg/dL (ref 0.0–1.2)
CO2: 22 mmol/L (ref 20–29)
Calcium: 8.7 mg/dL (ref 8.7–10.2)
Chloride: 101 mmol/L (ref 96–106)
Creatinine, Ser: 0.73 mg/dL (ref 0.57–1.00)
Globulin, Total: 2.5 g/dL (ref 1.5–4.5)
Glucose: 77 mg/dL (ref 70–99)
Potassium: 3.6 mmol/L (ref 3.5–5.2)
Sodium: 138 mmol/L (ref 134–144)
Total Protein: 6.8 g/dL (ref 6.0–8.5)
eGFR: 102 mL/min/{1.73_m2} (ref >59)

## 2022-09-04 LAB — LIPID PANEL
Chol/HDL Ratio: 4.1 ratio (ref 0.0–4.4)
Cholesterol, Total: 230 mg/dL — ABNORMAL HIGH (ref 100–199)
HDL: 56 mg/dL (ref >39)
LDL Chol Calc (NIH): 147 mg/dL — ABNORMAL HIGH (ref 0–99)
Triglycerides: 150 mg/dL — ABNORMAL HIGH (ref 0–149)
VLDL Cholesterol Cal: 27 mg/dL (ref 5–40)

## 2022-09-04 LAB — VITAMIN D 25 HYDROXY (VIT D DEFICIENCY, FRACTURES): Vit D, 25-Hydroxy: 17 ng/mL — ABNORMAL LOW (ref 30.0–100.0)

## 2022-09-04 LAB — T4, FREE: Free T4: 1.37 ng/dL (ref 0.82–1.77)

## 2022-09-04 LAB — TSH: TSH: 2.31 u[IU]/mL (ref 0.450–4.500)

## 2022-09-10 ENCOUNTER — Encounter: Payer: Self-pay | Admitting: Nurse Practitioner

## 2022-09-10 ENCOUNTER — Ambulatory Visit: Payer: BC Managed Care – PPO | Admitting: Nurse Practitioner

## 2022-09-10 VITALS — BP 144/84 | HR 69 | Ht 60.0 in | Wt 166.4 lb

## 2022-09-10 DIAGNOSIS — E559 Vitamin D deficiency, unspecified: Secondary | ICD-10-CM | POA: Diagnosis not present

## 2022-09-10 DIAGNOSIS — E782 Mixed hyperlipidemia: Secondary | ICD-10-CM

## 2022-09-10 DIAGNOSIS — E063 Autoimmune thyroiditis: Secondary | ICD-10-CM

## 2022-09-10 DIAGNOSIS — R7303 Prediabetes: Secondary | ICD-10-CM | POA: Diagnosis not present

## 2022-09-10 DIAGNOSIS — E038 Other specified hypothyroidism: Secondary | ICD-10-CM | POA: Diagnosis not present

## 2022-09-10 DIAGNOSIS — I10 Essential (primary) hypertension: Secondary | ICD-10-CM

## 2022-09-10 LAB — POCT GLYCOSYLATED HEMOGLOBIN (HGB A1C): Hemoglobin A1C: 6.1 % — AB (ref 4.0–5.6)

## 2022-09-10 MED ORDER — LEVOTHYROXINE SODIUM 88 MCG PO TABS: ORAL_TABLET | ORAL | 1 refills | Status: DC

## 2022-09-10 MED ORDER — VITAMIN D (ERGOCALCIFEROL) 1.25 MG (50000 UNIT) PO CAPS: 50000.0000 [IU] | ORAL_CAPSULE | ORAL | 1 refills | Status: DC

## 2022-09-10 NOTE — Progress Notes (Signed)
09/10/2022                    Endocrinology Follow Up Visit  Subjective:    Patient ID: Renee Zamora, female    DOB: Feb 06, 1976, PCP Default, Provider, MD   Past Medical History:  Diagnosis Date   Essential hypertension, benign    Hypothyroidism (acquired)    Mixed hyperlipidemia    Prediabetes    Vitamin D deficiency    History reviewed. No pertinent surgical history. Social History   Socioeconomic History   Marital status: Single    Spouse name: Not on file   Number of children: Not on file   Years of education: Not on file   Highest education level: Not on file  Occupational History   Not on file  Tobacco Use   Smoking status: Never   Smokeless tobacco: Never  Vaping Use   Vaping Use: Never used  Substance and Sexual Activity   Alcohol use: No   Drug use: No   Sexual activity: Yes    Birth control/protection: Pill  Other Topics Concern   Not on file  Social History Narrative   Not on file   Social Determinants of Health   Financial Resource Strain: Not on file  Food Insecurity: Not on file  Transportation Needs: Not on file  Physical Activity: Not on file  Stress: Not on file  Social Connections: Not on file   Outpatient Encounter Medications as of 09/10/2022  Medication Sig   Vitamin D, Ergocalciferol, (DRISDOL) 1.25 MG (50000 UNIT) CAPS capsule Take 1 capsule (50,000 Units total) by mouth every 7 (seven) days.   [DISCONTINUED] levothyroxine (SYNTHROID) 75 MCG tablet TAKE 1 TABLET BY MOUTH DAILY BEFORE BREAKFAST.   levothyroxine (SYNTHROID) 88 MCG tablet TAKE 1 TABLET BY MOUTH DAILY BEFORE BREAKFAST.   No facility-administered encounter medications on file as of 09/10/2022.   ALLERGIES: No Known Allergies VACCINATION STATUS:  There is no immunization history on file for this patient.  Thyroid Problem Presents for follow-up visit. Symptoms include fatigue. Patient reports no anxiety, cold intolerance, constipation, depressed mood, hair loss, heat  intolerance, leg swelling, palpitations, tremors, weight gain or weight loss. The symptoms have been stable. Her past medical history is significant for hyperlipidemia.  Hyperlipidemia This is a chronic problem. The current episode started more than 1 year ago. The problem is uncontrolled. Recent lipid tests were reviewed and are high. Exacerbating diseases include hypothyroidism and obesity. Factors aggravating her hyperlipidemia include fatty foods. Current antihyperlipidemic treatment includes statins. Compliance problems include adherence to exercise and adherence to diet.  Risk factors for coronary artery disease include hypertension, a sedentary lifestyle and dyslipidemia.  Hypertension This is a chronic problem. The current episode started more than 1 year ago. The problem has been waxing and waning since onset. The problem is uncontrolled. Pertinent negatives include no palpitations. There are no associated agents to hypertension. Risk factors for coronary artery disease include dyslipidemia, obesity and sedentary lifestyle. Past treatments include nothing (has not been taking her BP meds as prescribed). Compliance problems include diet, exercise and psychosocial issues.  Identifiable causes of hypertension include a thyroid problem.    Renee Zamora is a 47 year old female patient with medical history of hypothyroidism due to Hashimoto's thyroiditis, hypertension, hyperlipidemia, prediabetes, and vitamin D deficiency.    - she denies family history of thyroid dysfunction. she denies personal history of goiter.   Review of systems  Constitutional: + difficulty losing weight despite daily exercise regimen and healthy  diet,  current Body mass index is 32.5 kg/m. , no fatigue, no subjective hyperthermia, no subjective hypothermia Eyes: no blurry vision, no xerophthalmia ENT: no sore throat, no nodules palpated in throat, no dysphagia/odynophagia, no hoarseness Cardiovascular: no chest pain, no  shortness of breath, no palpitations, no leg swelling Respiratory: no cough, no shortness of breath Gastrointestinal: no nausea/vomiting/diarrhea Musculoskeletal: no muscle/joint aches Skin: no rashes, no hyperemia Neurological: no tremors, no numbness, no tingling, no dizziness Psychiatric: no depression, no anxiety  Objective:    BP (!) 144/84 (BP Location: Right Arm, Patient Position: Sitting, Cuff Size: Large)   Pulse 69   Ht 5' (1.524 m)   Wt 166 lb 6.4 oz (75.5 kg)   BMI 32.50 kg/m   Wt Readings from Last 3 Encounters:  09/10/22 166 lb 6.4 oz (75.5 kg)  02/28/22 165 lb 6.4 oz (75 kg)  08/28/21 164 lb (74.4 kg)    BP Readings from Last 3 Encounters:  09/10/22 (!) 144/84  02/28/22 (!) 148/90  08/28/21 132/85     Physical Exam- Limited  Constitutional:  Body mass index is 32.5 kg/m. , not in acute distress, normal state of mind Eyes:  EOMI, no exophthalmos Musculoskeletal: no gross deformities, strength intact in all four extremities, no gross restriction of joint movements Skin:  no rashes, no hyperemia Neurological: no tremor with outstretched hands   Diabetic Foot Exam - Simple   No data filed     Recent Results (from the past 2160 hour(s))  Comprehensive metabolic panel     Status: None   Collection Time: 09/03/22  3:15 PM  Result Value Ref Range   Glucose 77 70 - 99 mg/dL   BUN 10 6 - 24 mg/dL   Creatinine, Ser 8.29 0.57 - 1.00 mg/dL   eGFR 562 >13 YQ/MVH/8.46   BUN/Creatinine Ratio 14 9 - 23   Sodium 138 134 - 144 mmol/L   Potassium 3.6 3.5 - 5.2 mmol/L   Chloride 101 96 - 106 mmol/L   CO2 22 20 - 29 mmol/L   Calcium 8.7 8.7 - 10.2 mg/dL   Total Protein 6.8 6.0 - 8.5 g/dL   Albumin 4.3 3.9 - 4.9 g/dL   Globulin, Total 2.5 1.5 - 4.5 g/dL   Bilirubin Total 0.8 0.0 - 1.2 mg/dL   Alkaline Phosphatase 79 44 - 121 IU/L   AST 18 0 - 40 IU/L   ALT 13 0 - 32 IU/L  Lipid panel     Status: Abnormal   Collection Time: 09/03/22  3:15 PM  Result Value Ref  Range   Cholesterol, Total 230 (H) 100 - 199 mg/dL   Triglycerides 962 (H) 0 - 149 mg/dL   HDL 56 >95 mg/dL   VLDL Cholesterol Cal 27 5 - 40 mg/dL   LDL Chol Calc (NIH) 284 (H) 0 - 99 mg/dL   Chol/HDL Ratio 4.1 0.0 - 4.4 ratio    Comment:                                   T. Chol/HDL Ratio                                             Men  Women  1/2 Avg.Risk  3.4    3.3                                   Avg.Risk  5.0    4.4                                2X Avg.Risk  9.6    7.1                                3X Avg.Risk 23.4   11.0   TSH     Status: None   Collection Time: 09/03/22  3:15 PM  Result Value Ref Range   TSH 2.310 0.450 - 4.500 uIU/mL  T4, free     Status: None   Collection Time: 09/03/22  3:15 PM  Result Value Ref Range   Free T4 1.37 0.82 - 1.77 ng/dL  VITAMIN D 25 Hydroxy (Vit-D Deficiency, Fractures)     Status: Abnormal   Collection Time: 09/03/22  3:15 PM  Result Value Ref Range   Vit D, 25-Hydroxy 17.0 (L) 30.0 - 100.0 ng/mL    Comment: Vitamin D deficiency has been defined by the Institute of Medicine and an Endocrine Society practice guideline as a level of serum 25-OH vitamin D less than 20 ng/mL (1,2). The Endocrine Society went on to further define vitamin D insufficiency as a level between 21 and 29 ng/mL (2). 1. IOM (Institute of Medicine). 2010. Dietary reference    intakes for calcium and D. [place]: The    [employer]. 2. Holick MF, [place], Bischoff-Ferrari HA, et al.    Evaluation, treatment, and prevention of vitamin D    deficiency: an Endocrine Society clinical practice    guideline. JCEM. 2011 Jul; 96(7):1911-30.   HgB A1c     Status: Abnormal   Collection Time: 09/10/22  3:55 PM  Result Value Ref Range   Hemoglobin A1C 6.1 (A) 4.0 - 5.6 %   HbA1c POC (<> result, manual entry)     HbA1c, POC (prediabetic range)     HbA1c, POC (controlled diabetic range)       Latest Reference Range &  Units 08/18/18 11:45 08/17/20 15:22 11/17/20 15:32 03/27/21 16:18 08/09/21 16:16 02/19/22 16:21  TSH 0.450 - 4.500 uIU/mL 0.37 (L) 2.920 1.550 0.964 1.240 0.965  T4,Free(Direct) 0.82 - 1.77 ng/dL 1.4 1.61 0.96 0.45 4.09 1.66  (L): Data is abnormally low   Assessment & Plan:   1. Hypothyroidism due to Hashimoto's thyroiditis Her previsit thyroid function tests are consistent with slight under-replacement.  She is advised to increase her Levothyroxine to 88 mcg po daily before breakfast.  Will recheck TFTs prior to next visit and adjust accordingly.    - We discussed about the correct intake of her thyroid hormone, on empty stomach at fasting, with water, separated by at least 30 minutes from breakfast and other medications,  and separated by more than 4 hours from calcium, iron, multivitamins, acid reflux medications (PPIs). -Patient is made aware of the fact that thyroid hormone replacement is needed for life, dose to be adjusted by periodic monitoring of thyroid function tests.  2. Essential hypertension Her blood pressure is slightly elevated today.  she is advised to monitor blood pressure at home.  If BP remains  elevated at next visit, we may need to reinitiate medication.  She has not been taking her BP medication since last visit for unknown reasons.  She is advised to adopt a DASH diet to assist.  3. Hyperlipidemia - Her most recent lipid panel from 09/03/22 shows uncontrolled LDL of 147 and elevated triglycerides of 150, improving slightly.  She has not been taking her Crestor for unknown reasons.  We discussed dietary changes to avoid needing statin in the future.  4. Vitamin D Deficiency Her recent vitamin d level on 09/03/22 was low at 17.  She does take her daily OTC Vitamin D3 5000 units daily.  I discussed and initiated Ergocalciferol 50000 units weekly.   5. Pre-diabetes -Her POCT A1c today is 6.1%, unchanged last visit, still in the prediabetes category.   She does not tolerate  Metformin, says this medication causes her significant GI upset with bloating and diarrhea.  She prefers to try to control her blood glucose with diet and exercise alone before trying alternative medications.  She can stay off medications at this time.  May consider incretin therapy in the future (would like to lose weight but would be hard to get insurance coverage without type 2 diagnosis).  The following Lifestyle Medicine recommendations according to American College of Lifestyle Medicine [facility]) were discussed and offered to patient and she agrees to start the journey:  A. Whole Foods, Plant-based plate comprising of fruits and vegetables, plant-based proteins, whole-grain carbohydrates was discussed in detail with the patient.   A list for source of those nutrients were also provided to the patient.  Patient will use only water or unsweetened tea for hydration. B.  The need to stay away from risky substances including alcohol, smoking; obtaining 7 to 9 hours of restorative sleep, at least 150 minutes of moderate intensity exercise weekly, the importance of healthy social connections,  and stress reduction techniques were discussed. C.  A full color page of  Calorie density of various food groups per pound showing examples of each food groups was provided to the patient.  - Nutritional counseling repeated at each appointment due to patients tendency to fall back in to old habits.  - The patient admits there is a room for improvement in their diet and drink choices. -  Suggestion is made for the patient to avoid simple carbohydrates from their diet including Cakes, Sweet Desserts / Pastries, Ice Cream, Soda (diet and regular), Sweet Tea, Candies, Chips, Cookies, Sweet Pastries, Store Bought Juices, Alcohol in Excess of 1-2 drinks a day, Artificial Sweeteners, Coffee Creamer, and "Sugar-free" Products. This will help patient to have stable blood glucose profile and potentially avoid unintended weight  gain.   - I encouraged the patient to switch to unprocessed or minimally processed complex starch and increased protein intake (animal or plant source), fruits, and vegetables.   - Patient is advised to stick to a routine mealtimes to eat 3 meals a day and avoid unnecessary snacks (to snack only to correct hypoglycemia).     I spent  21  minutes in the care of the patient today including review of labs from CMP, Lipids, Thyroid Function, Hematology (current and previous including abstractions from other facilities); face-to-face time discussing  her blood glucose readings/logs, discussing hypoglycemia and hyperglycemia episodes and symptoms, medications doses, her options of short and long term treatment based on the latest standards of care / guidelines;  discussion about incorporating lifestyle medicine;  and documenting the encounter. Risk reduction counseling performed per  USPSTF guidelines to reduce obesity and cardiovascular risk factors.     Please refer to Patient Instructions for Blood Glucose Monitoring and Insulin/Medications Dosing Guide"  in media tab for additional information. Please  also refer to " Patient Self Inventory" in the Media  tab for reviewed elements of pertinent patient history.  Bobbye Charleston participated in the discussions, expressed understanding, and voiced agreement with the above plans.  All questions were answered to her satisfaction. she is encouraged to contact clinic should she have any questions or concerns prior to her return visit.   Follow up plan: Return in about 4 months (around 01/11/2023) for Thyroid follow up, Previsit labs.  Ronny Bacon, [facility] [address] [place], [place] [address] Phone: [phone] Fax: [phone]  09/10/2022, 4:05 PM

## 2023-01-10 ENCOUNTER — Telehealth: Payer: Self-pay | Admitting: Nurse Practitioner

## 2023-01-10 DIAGNOSIS — E063 Autoimmune thyroiditis: Secondary | ICD-10-CM

## 2023-01-10 NOTE — Telephone Encounter (Signed)
Can someone update these labs, pt moved appt to next Available which was Feb

## 2023-01-10 NOTE — Telephone Encounter (Signed)
Order updated

## 2023-01-14 ENCOUNTER — Ambulatory Visit: Payer: BC Managed Care – PPO | Admitting: Nurse Practitioner

## 2023-03-04 ENCOUNTER — Other Ambulatory Visit: Payer: Self-pay | Admitting: Nurse Practitioner

## 2023-03-04 DIAGNOSIS — E063 Autoimmune thyroiditis: Secondary | ICD-10-CM

## 2023-03-04 DIAGNOSIS — E038 Other specified hypothyroidism: Secondary | ICD-10-CM

## 2023-04-08 ENCOUNTER — Telehealth: Payer: Self-pay | Admitting: Nurse Practitioner

## 2023-04-08 ENCOUNTER — Other Ambulatory Visit: Payer: Self-pay

## 2023-04-08 DIAGNOSIS — E063 Autoimmune thyroiditis: Secondary | ICD-10-CM

## 2023-04-08 NOTE — Telephone Encounter (Signed)
 Labs need to be updated.

## 2023-04-08 NOTE — Telephone Encounter (Signed)
 Labs updated and sent to [facility].

## 2023-04-17 ENCOUNTER — Ambulatory Visit: Payer: BC Managed Care – PPO | Admitting: Nurse Practitioner

## 2023-06-08 LAB — T4, FREE: Free T4: 1.84 ng/dL — ABNORMAL HIGH (ref 0.82–1.77)

## 2023-06-08 LAB — TSH: TSH: 0.052 u[IU]/mL — ABNORMAL LOW (ref 0.450–4.500)

## 2023-06-10 ENCOUNTER — Telehealth: Payer: Self-pay

## 2023-06-10 ENCOUNTER — Other Ambulatory Visit: Payer: Self-pay | Admitting: "Endocrinology

## 2023-06-10 DIAGNOSIS — E038 Other specified hypothyroidism: Secondary | ICD-10-CM

## 2023-06-10 DIAGNOSIS — E063 Autoimmune thyroiditis: Secondary | ICD-10-CM

## 2023-06-10 MED ORDER — LEVOTHYROXINE SODIUM 75 MCG PO TABS: 75.0000 ug | ORAL_TABLET | Freq: Every day | ORAL | 2 refills | Status: DC

## 2023-06-10 NOTE — Telephone Encounter (Signed)
 Spoke with pt advising her to discontinue the Levothyroxine and start daily per Dr.Nida's orders. Pt voiced understanding.

## 2023-06-10 NOTE — Telephone Encounter (Signed)
-----   Message from [place] Nida sent at 06/10/2023  1:59 PM EDT ----- Ander Slade, I want to lower her levothyroxine to 75 mcg po qam. Her 88 mcg seems to be too high. I want her to keep her appointment. ----- Message ----- From: Nell Range Lab Results In Sent: 06/08/2023   5:37 AM EDT To: Roma Kayser, MD

## 2023-07-02 ENCOUNTER — Ambulatory Visit: Payer: BC Managed Care – PPO | Admitting: Nurse Practitioner

## 2023-07-04 ENCOUNTER — Ambulatory Visit: Admitting: Nurse Practitioner

## 2023-07-04 ENCOUNTER — Encounter: Payer: Self-pay | Admitting: Nurse Practitioner

## 2023-07-04 VITALS — BP 134/88 | HR 81 | Ht 60.0 in | Wt 164.4 lb

## 2023-07-04 DIAGNOSIS — E063 Autoimmune thyroiditis: Secondary | ICD-10-CM

## 2023-07-04 DIAGNOSIS — I10 Essential (primary) hypertension: Secondary | ICD-10-CM

## 2023-07-04 DIAGNOSIS — R7303 Prediabetes: Secondary | ICD-10-CM | POA: Diagnosis not present

## 2023-07-04 DIAGNOSIS — E782 Mixed hyperlipidemia: Secondary | ICD-10-CM | POA: Diagnosis not present

## 2023-07-04 DIAGNOSIS — E038 Other specified hypothyroidism: Secondary | ICD-10-CM | POA: Diagnosis not present

## 2023-07-04 DIAGNOSIS — E559 Vitamin D deficiency, unspecified: Secondary | ICD-10-CM

## 2023-07-04 LAB — POCT GLYCOSYLATED HEMOGLOBIN (HGB A1C): Hemoglobin A1C: 6.3 % — AB (ref 4.0–5.6)

## 2023-07-04 MED ORDER — LEVOTHYROXINE SODIUM 75 MCG PO TABS: 75.0000 ug | ORAL_TABLET | Freq: Every day | ORAL | 1 refills | Status: DC

## 2023-07-04 NOTE — Progress Notes (Signed)
 07/04/2023                    Endocrinology Follow Up Visit  Subjective:    Patient ID: Renee Zamora, female    DOB: 1975/09/20, PCP Default, Provider, MD   Past Medical History:  Diagnosis Date   Essential hypertension, benign    Hypothyroidism (acquired)    Mixed hyperlipidemia    Prediabetes    Vitamin D  deficiency    History reviewed. No pertinent surgical history. Social History   Socioeconomic History   Marital status: Single    Spouse name: Not on file   Number of children: Not on file   Years of education: Not on file   Highest education level: Not on file  Occupational History   Not on file  Tobacco Use   Smoking status: Never   Smokeless tobacco: Never  Vaping Use   Vaping status: Never Used  Substance and Sexual Activity   Alcohol use: No   Drug use: No   Sexual activity: Yes    Birth control/protection: Pill  Other Topics Concern   Not on file  Social History Narrative   Not on file   Social Drivers of Health   Financial Resource Strain: Not on file  Food Insecurity: Not on file  Transportation Needs: Not on file  Physical Activity: Not on file  Stress: Not on file  Social Connections: Not on file   Outpatient Encounter Medications as of 07/04/2023  Medication Sig   Vitamin D , Ergocalciferol , (DRISDOL ) 1.25 MG (50000 UNIT) CAPS capsule TAKE 1 CAPSULE (50,000 UNITS TOTAL) BY MOUTH EVERY 7 (SEVEN) DAYS   [DISCONTINUED] levothyroxine  (SYNTHROID ) 75 MCG tablet Take 1 tablet (75 mcg total) by mouth daily before breakfast.   levothyroxine  (SYNTHROID ) 75 MCG tablet Take 1 tablet (75 mcg total) by mouth daily before breakfast.   No facility-administered encounter medications on file as of 07/04/2023.   ALLERGIES: No Known Allergies VACCINATION STATUS:  There is no immunization history on file for this patient.  Thyroid  Problem Presents for follow-up visit. Symptoms include fatigue. Patient reports no anxiety, cold intolerance, constipation, depressed  mood, hair loss, heat intolerance, leg swelling, palpitations, tremors, weight gain or weight loss. The symptoms have been stable.  Hyperlipidemia This is a chronic problem. The current episode started more than 1 year ago. The problem is uncontrolled. Recent lipid tests were reviewed and are high. Exacerbating diseases include hypothyroidism and obesity. Factors aggravating her hyperlipidemia include fatty foods. Current antihyperlipidemic treatment includes statins. Compliance problems include adherence to exercise and adherence to diet.  Risk factors for coronary artery disease include hypertension, a sedentary lifestyle and dyslipidemia.  Hypertension This is a chronic problem. The current episode started more than 1 year ago. The problem has been waxing and waning since onset. The problem is uncontrolled. Pertinent negatives include no palpitations. There are no associated agents to hypertension. Risk factors for coronary artery disease include dyslipidemia, obesity and sedentary lifestyle. Past treatments include nothing (has not been taking her BP meds as prescribed). Compliance problems include diet, exercise and psychosocial issues.  Identifiable causes of hypertension include a thyroid  problem.    [name] is a 48 year old female patient with medical history of hypothyroidism due to Hashimoto's thyroiditis, hypertension, hyperlipidemia, prediabetes, and vitamin D  deficiency.    - she denies family history of thyroid  dysfunction. she denies personal history of goiter.   Review of systems  Constitutional: + difficulty losing weight despite daily exercise regimen and healthy diet,  current Body mass index is 32.11 kg/m. , no fatigue, no subjective hyperthermia, no subjective hypothermia Eyes: no blurry vision, no xerophthalmia ENT: no sore throat, no nodules palpated in throat, no dysphagia/odynophagia, no hoarseness Cardiovascular: no chest pain, no shortness of breath, no palpitations, +  leg swelling and periorbital swelling Respiratory: no cough, no shortness of breath Gastrointestinal: no nausea/vomiting/diarrhea Musculoskeletal: no muscle/joint aches Skin: no rashes, no hyperemia Neurological: no tremors, no numbness, no tingling, no dizziness Psychiatric: no depression, no anxiety  Objective:    BP 134/88 (BP Location: Left Arm, Patient Position: Sitting, Cuff Size: Large)   Pulse 81   Ht 5' (1.524 m)   Wt 164 lb 6.4 oz (74.6 kg)   BMI 32.11 kg/m   Wt Readings from Last 3 Encounters:  07/04/23 164 lb 6.4 oz (74.6 kg)  09/10/22 166 lb 6.4 oz (75.5 kg)  02/28/22 165 lb 6.4 oz (75 kg)    BP Readings from Last 3 Encounters:  07/04/23 134/88  09/10/22 (!) 144/84  02/28/22 (!) 148/90     Physical Exam- Limited  Constitutional:  Body mass index is 32.11 kg/m. , not in acute distress, normal state of mind Eyes:  EOMI, no exophthalmos, + periorbital edema Musculoskeletal: no gross deformities, strength intact in all four extremities, no gross restriction of joint movements Skin:  no rashes, no hyperemia Neurological: no tremor with outstretched hands   Diabetic Foot Exam - Simple   No data filed     Recent Results (from the past 2160 hours)  TSH     Status: Abnormal   Collection Time: 06/07/23  1:13 PM  Result Value Ref Range   TSH 0.052 (L) 0.450 - 4.500 uIU/mL  T4, Free     Status: Abnormal   Collection Time: 06/07/23  1:13 PM  Result Value Ref Range   Free T4 1.84 (H) 0.82 - 1.77 ng/dL  HgB Y7W     Status: Abnormal   Collection Time: 07/04/23  4:10 PM  Result Value Ref Range   Hemoglobin A1C 6.3 (A) 4.0 - 5.6 %   HbA1c POC (<> result, manual entry)     HbA1c, POC (prediabetic range)     HbA1c, POC (controlled diabetic range)       Latest Reference Range & Units 08/18/18 11:45 08/17/20 15:22 11/17/20 15:32 03/27/21 16:18 08/09/21 16:16 02/19/22 16:21  TSH 0.450 - 4.500 uIU/mL 0.37 (L) 2.920 1.550 0.964 1.240 0.965  T4,Free(Direct) 0.82 -  1.77 ng/dL 1.4 2.95 6.21 3.08 6.57 1.66  (L): Data is abnormally low   Assessment & Plan:   1. Hypothyroidism due to Hashimoto's thyroiditis Her previsit thyroid  function tests are consistent with slight over-replacement.  Between visits, she was called and advised to lower back to 75 mcg.  She notes since then she has noticed more swelling.  She will need minimum of 6 weeks on this dose before we can recheck labs and assess where she is.    - We discussed about the correct intake of her thyroid  hormone, on empty stomach at fasting, with water, separated by at least 30 minutes from breakfast and other medications,  and separated by more than 4 hours from calcium , iron, multivitamins, acid reflux medications (PPIs). -Patient is made aware of the fact that thyroid  hormone replacement is needed for life, dose to be adjusted by periodic monitoring of thyroid  function tests.  2. Essential hypertension Her blood pressure is at target, borderline high today.  she is advised to monitor blood pressure at  home.  If BP remains elevated at next visit, we may need to reinitiate medication.  She has not been taking her BP medication since last visit for unknown reasons.  She is advised to adopt a DASH diet to assist.  3. Hyperlipidemia - Her most recent lipid panel from 09/03/22 shows uncontrolled LDL of 147 and elevated triglycerides of 150, improving slightly.  She has not been taking her Crestor  for unknown reasons.  We discussed dietary changes to avoid needing statin in the future.  4. Vitamin D  Deficiency Her recent vitamin d  level on 09/03/22 was low at 17.  She does take her daily OTC Vitamin D3 5000 units daily.  I discussed and initiated Ergocalciferol  50000 units weekly. Will recheck prior to next visit  5. Pre-diabetes -Her POCT A1c today is 6.3%, increasing from last visit, still in the prediabetes category.   She does not tolerate Metformin , says this medication causes her significant GI upset  with bloating and diarrhea.  She prefers to try to control her blood glucose with diet and exercise alone before trying alternative medications.  She can stay off medications at this time.  May consider incretin therapy in the future (would like to lose weight but would be hard to get insurance coverage without type 2 diagnosis).  The following Lifestyle Medicine recommendations according to American College of Lifestyle Medicine [facility]) were discussed and offered to patient and she agrees to start the journey:  A. Whole Foods, Plant-based plate comprising of fruits and vegetables, plant-based proteins, whole-grain carbohydrates was discussed in detail with the patient.   A list for source of those nutrients were also provided to the patient.  Patient will use only water or unsweetened tea for hydration. B.  The need to stay away from risky substances including alcohol, smoking; obtaining 7 to 9 hours of restorative sleep, at least 150 minutes of moderate intensity exercise weekly, the importance of healthy social connections,  and stress reduction techniques were discussed. C.  A full color page of  Calorie density of various food groups per pound showing examples of each food groups was provided to the patient.  - Nutritional counseling repeated at each appointment due to patients tendency to fall back in to old habits.  - The patient admits there is a room for improvement in their diet and drink choices. -  Suggestion is made for the patient to avoid simple carbohydrates from their diet including Cakes, Sweet Desserts / Pastries, Ice Cream, Soda (diet and regular), Sweet Tea, Candies, Chips, Cookies, Sweet Pastries, Store Bought Juices, Alcohol in Excess of 1-2 drinks a day, Artificial Sweeteners, Coffee Creamer, and "Sugar-free" Products. This will help patient to have stable blood glucose profile and potentially avoid unintended weight gain.   - I encouraged the patient to switch to unprocessed or  minimally processed complex starch and increased protein intake (animal or plant source), fruits, and vegetables.   - Patient is advised to stick to a routine mealtimes to eat 3 meals a day and avoid unnecessary snacks (to snack only to correct hypoglycemia).     I spent  21  minutes in the care of the patient today including review of labs from CMP, Lipids, Thyroid  Function, Hematology (current and previous including abstractions from other facilities); face-to-face time discussing  her blood glucose readings/logs, discussing hypoglycemia and hyperglycemia episodes and symptoms, medications doses, her options of short and long term treatment based on the latest standards of care / guidelines;  discussion about incorporating lifestyle  medicine;  and documenting the encounter. Risk reduction counseling performed per USPSTF guidelines to reduce obesity and cardiovascular risk factors.     Please refer to Patient Instructions for Blood Glucose Monitoring and Insulin/Medications Dosing Guide"  in media tab for additional information. Please  also refer to " Patient Self Inventory" in the Media  tab for reviewed elements of pertinent patient history.  Asberry Lav participated in the discussions, expressed understanding, and voiced agreement with the above plans.  All questions were answered to her satisfaction. she is encouraged to contact clinic should she have any questions or concerns prior to her return visit.   Follow up plan: Return labs in 3 weeks- will call with results and next steps.  Hulon Magic, [facility] [address] [place], [place] [address] Phone: [phone] Fax: [phone]  07/04/2023, 4:24 PM

## 2023-07-04 NOTE — Patient Instructions (Signed)
 Diabetes mellitus y nutricin, en adultos Diabetes Mellitus and Nutrition, Adult Si sufre de diabetes, o diabetes mellitus, es muy importante tener hbitos alimenticios saludables debido a que sus niveles de Psychologist, counselling sangre (glucosa) se ven afectados en gran medida por lo que come y bebe. Comer alimentos saludables en las cantidades correctas, aproximadamente a la misma hora todos los [place], [place] ayudar a: Chief Operating Officer su glucemia. Disminuir el riesgo de sufrir una enfermedad cardaca. Mejorar la presin arterial. Barista o mantener un peso saludable. Qu puede afectar mi plan de alimentacin? Todas las personas que sufren de diabetes son diferentes y cada una tiene necesidades diferentes en cuanto a un plan de alimentacin. El mdico puede recomendarle que trabaje con un nutricionista para elaborar el mejor plan para usted. Su plan de alimentacin puede variar segn factores como: Las caloras que necesita. Los medicamentos que toma. Su peso. Sus niveles de glucemia, presin arterial y colesterol. Su nivel de [place]. Otras afecciones que tenga, como enfermedades cardacas o renales. Cmo me afectan los carbohidratos? Los carbohidratos, o hidratos de carbono, afectan su nivel de glucemia ms que cualquier otro tipo de alimento. La ingesta de carbohidratos aumenta la cantidad de [employer]. Es importante conocer la cantidad de carbohidratos que se pueden ingerir en cada comida sin correr Surveyor, minerals. Esto es Government social research officer. Su nutricionista puede ayudarlo a calcular la cantidad de carbohidratos que debe ingerir en cada comida y en cada refrigerio. Cmo me afecta el alcohol? El alcohol puede provocar una disminucin de la glucemia (hipoglucemia), especialmente si [place] insulina o toma determinados medicamentos por va oral para la diabetes. La hipoglucemia es una afeccin potencialmente mortal. Los sntomas de la hipoglucemia, como somnolencia, mareos y confusin, son  similares a los sntomas de haber consumido demasiado alcohol. No beba alcohol si: Su mdico le indica no hacerlo. Est embarazada, puede estar embarazada o est tratando de [place]. Si bebe alcohol: Limite la cantidad que bebe a lo siguiente: De 0 a 1 medida por da para las mujeres. De 0 a 2 medidas por da para los hombres. Sepa cunta cantidad de alcohol hay en las bebidas que toma. En los [address], una medida equivale a una botella de cerveza de 12 oz (355 ml), un vaso de vino de 5 oz (148 ml) o un vaso de una bebida alcohlica de alta graduacin de 1 oz (44 ml). Mantngase hidratado bebiendo agua, refrescos dietticos o t helado sin azcar. Tenga en cuenta que los refrescos comunes, los jugos y otras bebidas para mezclar pueden contener Product/process development scientist y se deben contar como carbohidratos. Consejos para seguir Social worker las etiquetas de los alimentos Comience por leer el tamao de la porcin en la etiqueta de Informacin nutricional de los alimentos envasados y las bebidas. La cantidad de caloras, carbohidratos, grasas y otros nutrientes detallados en la etiqueta se basan en una porcin del alimento. Muchos alimentos contienen ms de una porcin por envase. Verifique la cantidad total de gramos (g) de carbohidratos totales en una porcin. Verifique la cantidad de gramos de grasas saturadas y grasas trans en una porcin. Escoja alimentos que no contengan estas grasas o que su contenido de estas sea [place]. Verifique la cantidad de miligramos (mg) de sal (sodio) en una porcin. La [employer] de las personas deben limitar la ingesta de sodio total a menos de 2300 mg Google. Siempre consulte la informacin nutricional de los alimentos etiquetados como "con bajo contenido de grasa" o "sin grasa".  Estos alimentos pueden tener un mayor contenido de International aid/development worker agregada o carbohidratos refinados, y deben evitarse. Hable con su nutricionista para identificar sus objetivos diarios en  cuanto a los nutrientes mencionados en la etiqueta. Al ir de compras Evite comprar alimentos procesados, enlatados o precocidos. Estos alimentos tienden a Counselling psychologist mayor cantidad de [place], sodio y azcar agregada. Compre en la zona exterior de la tienda de comestibles. Esta es la zona donde se encuentran con mayor frecuencia las frutas y las verduras frescas, los cereales a granel, las carnes frescas y los productos lcteos frescos. Al cocinar Use mtodos de coccin a baja temperatura, como hornear, en lugar de mtodos de coccin a alta temperatura, como frer en abundante aceite. Cocine con aceites saludables, como el aceite de [place], canola o [place]. Evite cocinar con manteca, crema o carnes con alto contenido de grasa. Planificacin de las comidas Coma las comidas y los refrigerios regularmente, preferentemente a la misma hora todos [place]. Evite pasar largos perodos de tiempo sin comer. Consuma alimentos ricos en fibra, como frutas frescas, verduras, frijoles y cereales integrales. Consuma entre 4 y 6 onzas (entre 112 y 168 g) de protenas magras por da, como carnes [place], pollo, pescado, huevos o tofu. Una onza (oz) (28 g) de protena magra equivale a: 1 onza (28 g) de carne, pollo o pescado. 1 huevo.  taza (62 g) de tofu. Coma algunos alimentos por da que contengan grasas saludables, como aguacates, frutos secos, semillas y pescado. Qu alimentos debo comer? Nils Pyle Bayas. Manzanas. Naranjas. Duraznos. Damascos. Ciruelas. Uvas. Mangos. Papayas. Granadas. Kiwi. Cerezas. Verduras Verduras de [employer], que incluyen [place], [place], col rizada, acelga, hojas de berza, hojas de mostaza y repollo. Remolachas. Coliflor. Brcoli. Zanahorias. Judas verdes. Tomates. Pimientos. Cebollas. Pepinos. Coles de Bruselas. Granos Granos integrales, como panes, galletas, tortillas, cereales y pastas de salvado o integrales. Avena sin azcar. Quinua. Arroz integral o salvaje. Carnes y otras  protenas Frutos de mar. Carne de ave sin piel. Cortes magros de ave y carne de res. Tofu. Frutos secos. Semillas. Lcteos Productos lcteos sin grasa o con bajo contenido de [place], [place], yogur y [place]. Es posible que los productos detallados arriba no constituyan una lista completa de los alimentos y las bebidas que puede tomar. Consulte a un nutricionista para obtener ms informacin. Qu alimentos debo evitar? Nils Pyle Frutas enlatadas al almbar. Verduras Verduras enlatadas. Verduras congeladas con mantequilla o salsa de crema. Granos Productos elaborados con [place] y [place], como panes, pastas, bocadillos y cereales. Evite todos los alimentos procesados. Carnes y [address] de carne con alto contenido de Holiday representative. Carne de ave con piel. Carnes empanizadas o fritas. Carne procesada. Evite las grasas saturadas. Lcteos Yogur, queso o Cardinal Health. Bebidas Bebidas azucaradas, como gaseosas o t helado. Es posible que los productos que se enumeran ms [place] no constituyan una lista completa de los alimentos y las bebidas que Personnel officer. Consulte a un nutricionista para obtener ms informacin. Preguntas para hacerle al mdico Debo consultar con un especialista certificado en atencin y educacin sobre la diabetes? Es necesario que me rena con un nutricionista? A qu nmero puedo llamar si tengo preguntas? Cules son los mejores momentos para controlar la glucemia? Dnde encontrar ms informacin: American Diabetes Association (Asociacin Estadounidense de la Diabetes): diabetes.org Academy of Nutrition and Dietetics (Academia de Nutricin y Pension scheme manager): eatright.Dana Corporation of Diabetes and Digestive and Kidney Diseases [employer] de la Diabetes y las Enfermedades Digestivas y Renales): StageSync.si Association of Diabetes  Care & Education Specialists (Asociacin de Especialistas en Atencin y Francella Solian la Diabetes):  diabeteseducator.org Resumen Es importante tener hbitos alimenticios saludables debido a que sus niveles de Psychologist, counselling sangre (glucosa) se ven afectados en gran medida por lo que come y bebe. Es importante consumir alcohol con prudencia. Un plan de comidas saludable lo ayudar a controlar la glucosa en sangre y a reducir el riesgo de enfermedades cardacas. El mdico puede recomendarle que trabaje con un nutricionista para elaborar el mejor plan para usted. Esta informacin no tiene Theme park manager el consejo del mdico. Asegrese de hacerle al mdico cualquier pregunta que tenga. Document Revised: 10/28/2019 Document Reviewed: 10/28/2019 Elsevier Patient Education  2024 [employer].

## 2023-07-08 DIAGNOSIS — N920 Excessive and frequent menstruation with regular cycle: Secondary | ICD-10-CM | POA: Diagnosis not present

## 2023-07-08 DIAGNOSIS — Z01419 Encounter for gynecological examination (general) (routine) without abnormal findings: Secondary | ICD-10-CM | POA: Diagnosis not present

## 2023-07-08 DIAGNOSIS — Z3202 Encounter for pregnancy test, result negative: Secondary | ICD-10-CM | POA: Diagnosis not present

## 2023-07-08 DIAGNOSIS — Z3009 Encounter for other general counseling and advice on contraception: Secondary | ICD-10-CM | POA: Diagnosis not present

## 2023-07-08 DIAGNOSIS — Z1151 Encounter for screening for human papillomavirus (HPV): Secondary | ICD-10-CM | POA: Diagnosis not present

## 2023-07-25 DIAGNOSIS — Z1231 Encounter for screening mammogram for malignant neoplasm of breast: Secondary | ICD-10-CM | POA: Diagnosis not present

## 2023-07-26 DIAGNOSIS — N920 Excessive and frequent menstruation with regular cycle: Secondary | ICD-10-CM | POA: Diagnosis not present

## 2023-07-26 DIAGNOSIS — D250 Submucous leiomyoma of uterus: Secondary | ICD-10-CM | POA: Diagnosis not present

## 2023-08-01 DIAGNOSIS — Z23 Encounter for immunization: Secondary | ICD-10-CM | POA: Diagnosis not present

## 2023-08-21 ENCOUNTER — Other Ambulatory Visit: Payer: Self-pay | Admitting: Nurse Practitioner

## 2023-08-21 DIAGNOSIS — E063 Autoimmune thyroiditis: Secondary | ICD-10-CM

## 2023-08-21 DIAGNOSIS — E038 Other specified hypothyroidism: Secondary | ICD-10-CM

## 2023-08-22 ENCOUNTER — Other Ambulatory Visit: Payer: Self-pay | Admitting: Nurse Practitioner

## 2023-08-22 DIAGNOSIS — N924 Excessive bleeding in the premenopausal period: Secondary | ICD-10-CM | POA: Diagnosis not present

## 2023-08-22 DIAGNOSIS — R9389 Abnormal findings on diagnostic imaging of other specified body structures: Secondary | ICD-10-CM | POA: Diagnosis not present

## 2023-08-22 DIAGNOSIS — D219 Benign neoplasm of connective and other soft tissue, unspecified: Secondary | ICD-10-CM | POA: Diagnosis not present

## 2023-10-25 DIAGNOSIS — E559 Vitamin D deficiency, unspecified: Secondary | ICD-10-CM | POA: Diagnosis not present

## 2023-10-25 DIAGNOSIS — R7303 Prediabetes: Secondary | ICD-10-CM | POA: Diagnosis not present

## 2023-10-25 DIAGNOSIS — E063 Autoimmune thyroiditis: Secondary | ICD-10-CM | POA: Diagnosis not present

## 2023-10-26 LAB — LIPID PANEL
Chol/HDL Ratio: 5.6 ratio — ABNORMAL HIGH (ref 0.0–4.4)
Cholesterol, Total: 219 mg/dL — ABNORMAL HIGH (ref 100–199)
HDL: 39 mg/dL — ABNORMAL LOW (ref >39)
LDL Chol Calc (NIH): 160 mg/dL — ABNORMAL HIGH (ref 0–99)
Triglycerides: 110 mg/dL (ref 0–149)
VLDL Cholesterol Cal: 20 mg/dL (ref 5–40)

## 2023-10-26 LAB — COMPREHENSIVE METABOLIC PANEL WITH GFR
ALT: 21 IU/L (ref 0–32)
AST: 19 IU/L (ref 0–40)
Albumin: 4.5 g/dL (ref 3.9–4.9)
Alkaline Phosphatase: 87 IU/L (ref 44–121)
BUN/Creatinine Ratio: 14 (ref 9–23)
BUN: 11 mg/dL (ref 6–24)
Bilirubin Total: 0.8 mg/dL (ref 0.0–1.2)
CO2: 21 mmol/L (ref 20–29)
Calcium: 9.2 mg/dL (ref 8.7–10.2)
Chloride: 102 mmol/L (ref 96–106)
Creatinine, Ser: 0.8 mg/dL (ref 0.57–1.00)
Globulin, Total: 2.7 g/dL (ref 1.5–4.5)
Glucose: 133 mg/dL — ABNORMAL HIGH (ref 70–99)
Potassium: 4 mmol/L (ref 3.5–5.2)
Sodium: 139 mmol/L (ref 134–144)
Total Protein: 7.2 g/dL (ref 6.0–8.5)
eGFR: 91 mL/min/1.73 (ref >59)

## 2023-10-26 LAB — TSH: TSH: 0.92 u[IU]/mL (ref 0.450–4.500)

## 2023-10-26 LAB — T4, FREE: Free T4: 1.54 ng/dL (ref 0.82–1.77)

## 2023-10-26 LAB — VITAMIN D 25 HYDROXY (VIT D DEFICIENCY, FRACTURES): Vit D, 25-Hydroxy: 42 ng/mL (ref 30.0–100.0)

## 2023-11-01 DIAGNOSIS — D250 Submucous leiomyoma of uterus: Secondary | ICD-10-CM | POA: Diagnosis not present

## 2024-01-25 ENCOUNTER — Other Ambulatory Visit: Payer: Self-pay | Admitting: Nurse Practitioner

## 2024-01-25 DIAGNOSIS — E063 Autoimmune thyroiditis: Secondary | ICD-10-CM

## 2024-01-25 DIAGNOSIS — E038 Other specified hypothyroidism: Secondary | ICD-10-CM

## 2024-01-29 ENCOUNTER — Other Ambulatory Visit: Payer: Self-pay | Admitting: Nurse Practitioner
